# Patient Record
Sex: Female | Born: 1985
Health system: Southern US, Community
[De-identification: ages and names within clinical notes are randomized; demographics above are authoritative.]

## PROBLEM LIST (undated history)

## (undated) ENCOUNTER — Inpatient Hospital Stay (HOSPITAL_COMMUNITY): Payer: Self-pay

## (undated) HISTORY — PX: HERNIA REPAIR: SHX51

---
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
## 2008-02-27 ENCOUNTER — Emergency Department (HOSPITAL_COMMUNITY): Admission: EM | Admit: 2008-02-27 | Discharge: 2008-02-27 | Payer: Self-pay | Admitting: Family Medicine

## 2008-07-09 ENCOUNTER — Emergency Department (HOSPITAL_COMMUNITY): Admission: EM | Admit: 2008-07-09 | Discharge: 2008-07-09 | Payer: Self-pay | Admitting: Family Medicine

## 2008-09-23 ENCOUNTER — Emergency Department (HOSPITAL_COMMUNITY): Admission: EM | Admit: 2008-09-23 | Discharge: 2008-09-23 | Payer: Self-pay | Admitting: Family Medicine

## 2008-10-07 ENCOUNTER — Emergency Department (HOSPITAL_COMMUNITY): Admission: EM | Admit: 2008-10-07 | Discharge: 2008-10-07 | Payer: Self-pay | Admitting: Emergency Medicine

## 2008-10-21 ENCOUNTER — Ambulatory Visit: Payer: Self-pay | Admitting: Advanced Practice Midwife

## 2008-10-21 ENCOUNTER — Inpatient Hospital Stay (HOSPITAL_COMMUNITY): Admission: AD | Admit: 2008-10-21 | Discharge: 2008-10-21 | Payer: Self-pay | Admitting: Obstetrics

## 2009-10-18 ENCOUNTER — Emergency Department (HOSPITAL_COMMUNITY): Admission: EM | Admit: 2009-10-18 | Discharge: 2009-10-18 | Payer: Self-pay | Admitting: Emergency Medicine

## 2009-12-08 ENCOUNTER — Inpatient Hospital Stay (HOSPITAL_COMMUNITY): Admission: AD | Admit: 2009-12-08 | Discharge: 2009-12-08 | Payer: Self-pay | Admitting: Obstetrics

## 2009-12-08 ENCOUNTER — Ambulatory Visit: Payer: Self-pay | Admitting: Nurse Practitioner

## 2010-06-19 ENCOUNTER — Inpatient Hospital Stay (HOSPITAL_COMMUNITY)
Admission: AD | Admit: 2010-06-19 | Discharge: 2010-06-21 | DRG: 775 | Disposition: A | Discharge status: Home / Self Care | Payer: Medicaid Other | Source: Ambulatory Visit | Attending: Obstetrics and Gynecology | Admitting: Obstetrics and Gynecology

## 2010-06-20 LAB — CBC
HCT: 31.7 % — ABNORMAL LOW (ref 36.0–46.0)
Hemoglobin: 10.3 g/dL — ABNORMAL LOW (ref 12.0–15.0)
MCHC: 32.5 g/dL (ref 30.0–36.0)
MCV: 88.8 fL (ref 78.0–100.0)

## 2010-07-26 LAB — WET PREP, GENITAL: Yeast Wet Prep HPF POC: NONE SEEN

## 2010-07-26 LAB — URINALYSIS, ROUTINE W REFLEX MICROSCOPIC
Glucose, UA: NEGATIVE mg/dL
Hgb urine dipstick: NEGATIVE
Ketones, ur: 15 mg/dL — AB
pH: 6.5 (ref 5.0–8.0)

## 2010-08-18 LAB — GC/CHLAMYDIA PROBE AMP, GENITAL
Chlamydia, DNA Probe: NEGATIVE
GC Probe Amp, Genital: NEGATIVE

## 2010-08-19 LAB — URINALYSIS, ROUTINE W REFLEX MICROSCOPIC
Bilirubin Urine: NEGATIVE
Glucose, UA: NEGATIVE mg/dL
Hgb urine dipstick: NEGATIVE
Ketones, ur: NEGATIVE mg/dL
Nitrite: NEGATIVE
Protein, ur: NEGATIVE mg/dL
Specific Gravity, Urine: 1.008 (ref 1.005–1.030)
Urobilinogen, UA: 0.2 mg/dL (ref 0.0–1.0)
pH: 7 (ref 5.0–8.0)

## 2010-08-19 LAB — URINE CULTURE: Colony Count: 3000

## 2010-08-19 LAB — WET PREP, GENITAL
Trich, Wet Prep: NONE SEEN
Yeast Wet Prep HPF POC: NONE SEEN
Yeast Wet Prep HPF POC: NONE SEEN

## 2010-08-19 LAB — GC/CHLAMYDIA PROBE AMP, GENITAL
Chlamydia, DNA Probe: NEGATIVE
Chlamydia, DNA Probe: NEGATIVE
GC Probe Amp, Genital: NEGATIVE
GC Probe Amp, Genital: NEGATIVE

## 2010-08-21 LAB — POCT URINALYSIS DIP (DEVICE)
Ketones, ur: 15 mg/dL — AB
pH: 7 (ref 5.0–8.0)

## 2010-08-21 LAB — WET PREP, GENITAL: Clue Cells Wet Prep HPF POC: NONE SEEN
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
## 2011-08-04 ENCOUNTER — Emergency Department (HOSPITAL_COMMUNITY)
Admission: EM | Admit: 2011-08-04 | Discharge: 2011-08-04 | Discharge status: Against Medical Advice | Payer: Medicaid Other | Attending: Emergency Medicine | Admitting: Emergency Medicine

## 2011-08-04 ENCOUNTER — Encounter (HOSPITAL_COMMUNITY): Payer: Self-pay | Admitting: Emergency Medicine

## 2011-08-04 DIAGNOSIS — N898 Other specified noninflammatory disorders of vagina: Secondary | ICD-10-CM | POA: Insufficient documentation

## 2011-08-04 NOTE — ED Notes (Signed)
Called pt to take to treatment room without response noted from lobby

## 2011-08-04 NOTE — ED Notes (Signed)
Pt states she is having a thick white discharge from her vagina   Pt states it is starting to have an odor  Pt states it started about 2 weeks ago

## 2011-08-05 ENCOUNTER — Emergency Department (HOSPITAL_BASED_OUTPATIENT_CLINIC_OR_DEPARTMENT_OTHER)
Admission: EM | Admit: 2011-08-05 | Discharge: 2011-08-05 | Disposition: A | Discharge status: Home Health | Payer: Medicaid Other | Attending: Emergency Medicine | Admitting: Emergency Medicine

## 2011-08-05 ENCOUNTER — Encounter (HOSPITAL_BASED_OUTPATIENT_CLINIC_OR_DEPARTMENT_OTHER): Payer: Self-pay | Admitting: *Deleted

## 2011-08-05 DIAGNOSIS — N760 Acute vaginitis: Secondary | ICD-10-CM | POA: Insufficient documentation

## 2011-08-05 DIAGNOSIS — B9689 Other specified bacterial agents as the cause of diseases classified elsewhere: Secondary | ICD-10-CM

## 2011-08-05 DIAGNOSIS — A499 Bacterial infection, unspecified: Secondary | ICD-10-CM | POA: Insufficient documentation

## 2011-08-05 LAB — URINALYSIS, ROUTINE W REFLEX MICROSCOPIC
Bilirubin Urine: NEGATIVE
Glucose, UA: NEGATIVE mg/dL
Hgb urine dipstick: NEGATIVE
Ketones, ur: 40 mg/dL — AB
Nitrite: NEGATIVE
pH: 6 (ref 5.0–8.0)

## 2011-08-05 LAB — PREGNANCY, URINE: Preg Test, Ur: NEGATIVE

## 2011-08-05 LAB — WET PREP, GENITAL

## 2011-08-05 MED ORDER — METRONIDAZOLE 500 MG PO TABS: 500.0000 mg | ORAL_TABLET | Freq: Two times a day (BID) | ORAL | Status: AC

## 2011-08-05 NOTE — Discharge Instructions (Signed)
Bacterial Vaginosis Bacterial vaginosis (BV) is a vaginal infection where the normal balance of bacteria in the vagina is disrupted. The normal balance is then replaced by an overgrowth of certain bacteria. There are several different kinds of bacteria that can cause BV. BV is the most common vaginal infection in women of childbearing age. CAUSES   The cause of BV is not fully understood. BV develops when there is an increase or imbalance of harmful bacteria.   Some activities or behaviors can upset the normal balance of bacteria in the vagina and put women at increased risk including:   Having a new sex partner or multiple sex partners.   Douching.   Using an intrauterine device (IUD) for contraception.   It is not clear what role sexual activity plays in the development of BV. However, women that have never had sexual intercourse are rarely infected with BV.  Women do not get BV from toilet seats, bedding, swimming pools or from touching objects around them.  SYMPTOMS   Grey vaginal discharge.   A fish-like odor with discharge, especially after sexual intercourse.   Itching or burning of the vagina and vulva.   Burning or pain with urination.   Some women have no signs or symptoms at all.  DIAGNOSIS  Your caregiver must examine the vagina for signs of BV. Your caregiver will perform lab tests and look at the sample of vaginal fluid through a microscope. They will look for bacteria and abnormal cells (clue cells), a pH test higher than 4.5, and a positive amine test all associated with BV.  RISKS AND COMPLICATIONS   Pelvic inflammatory disease (PID).   Infections following gynecology surgery.   Developing HIV.   Developing herpes virus.  TREATMENT  Sometimes BV will clear up without treatment. However, all women with symptoms of BV should be treated to avoid complications, especially if gynecology surgery is planned. Female partners generally do not need to be treated. However,  BV may spread between female sex partners so treatment is helpful in preventing a recurrence of BV.   BV may be treated with antibiotics. The antibiotics come in either pill or vaginal cream forms. Either can be used with nonpregnant or pregnant women, but the recommended dosages differ. These antibiotics are not harmful to the baby.   BV can recur after treatment. If this happens, a second round of antibiotics will often be prescribed.   Treatment is important for pregnant women. If not treated, BV can cause a premature delivery, especially for a pregnant woman who had a premature birth in the past. All pregnant women who have symptoms of BV should be checked and treated.   For chronic reoccurrence of BV, treatment with a type of prescribed gel vaginally twice a week is helpful.  HOME CARE INSTRUCTIONS   Finish all medication as directed by your caregiver.   Do not have sex until treatment is completed.   Tell your sexual partner that you have a vaginal infection. They should see their caregiver and be treated if they have problems, such as a mild rash or itching.   Practice safe sex. Use condoms. Only have 1 sex partner.  PREVENTION  Basic prevention steps can help reduce the risk of upsetting the natural balance of bacteria in the vagina and developing BV:  Do not have sexual intercourse (be abstinent).   Do not douche.   Use all of the medicine prescribed for treatment of BV, even if the signs and symptoms go away.     Tell your sex partner if you have BV. That way, they can be treated, if needed, to prevent reoccurrence.  SEEK MEDICAL CARE IF:   Your symptoms are not improving after 3 days of treatment.   You have increased discharge, pain, or fever.  MAKE SURE YOU:   Understand these instructions.   Will watch your condition.   Will get help right away if you are not doing well or get worse.  FOR MORE INFORMATION  Division of STD Prevention (DSTDP), Centers for Disease  Control and Prevention: www.cdc.gov/std American Social Health Association (ASHA): www.ashastd.org  Document Released: 04/27/2005 Document Revised: 04/16/2011 Document Reviewed: 10/18/2008 Exit[employer] Patient Information 2012 [employer]. 

## 2011-08-05 NOTE — ED Notes (Signed)
Patient ambulatory to triage.  Patient reports white vaginal discharge times 2 weeks.  patient reports odor starting over the past few days.

## 2011-08-05 NOTE — ED Provider Notes (Addendum)
History     CSN: [id number]  Arrival date & time 08/05/11  1450   First MD Initiated Contact with Patient 08/05/11 1555      Chief Complaint  Patient presents with  . Vaginal Discharge    (Consider location/radiation/quality/duration/timing/severity/associated sxs/prior treatment) HPI Comments: Patient percent complaining of 2 weeks of white vaginal discharge.  She notes it's been slowly developing an odor over the last few days.  She has had bacterial vaginosis before and believes it is similar.  She denies any burning or itching.  She has had an infection with trichomonas per her report.  She states that she is sexually active with the same partner and does use condoms.  She is also on the Depo-Provera shot for birth control.  Her last menstrual period was last week and slightly longer duration for her than normal.  She denies any abdominal pain, fevers, nausea, vomiting.  Patient is a 26 y.o. female presenting with vaginal discharge. The history is provided by the patient. No language interpreter was used.  Vaginal Discharge This is a new problem. The current episode started more than 1 week ago. The problem occurs constantly. The problem has been gradually worsening. Pertinent negatives include no chest pain, no abdominal pain, no headaches and no shortness of breath. The symptoms are aggravated by nothing. The symptoms are relieved by nothing. She has tried nothing for the symptoms.    History reviewed. No pertinent past medical history.  Past Surgical History  Procedure Date  . Hernia repair     No family history on file.  History  Substance Use Topics  . Smoking status: Never Smoker   . Smokeless tobacco: Not on file  . Alcohol Use: No    OB History    Grav Para Term Preterm Abortions TAB SAB Ect Mult Living                  Review of Systems  Constitutional: Negative.  Negative for fever and chills.  HENT: Negative.   Eyes: Negative.  Negative for discharge and  redness.  Respiratory: Negative.  Negative for cough and shortness of breath.   Cardiovascular: Negative.  Negative for chest pain.  Gastrointestinal: Negative.  Negative for nausea, vomiting, abdominal pain and diarrhea.  Genitourinary: Positive for vaginal discharge. Negative for dysuria.  Musculoskeletal: Negative.  Negative for back pain.  Skin: Negative.  Negative for color change and rash.  Neurological: Negative.  Negative for syncope and headaches.  Hematological: Negative.  Negative for adenopathy.  Psychiatric/Behavioral: Negative.  Negative for confusion.  All other systems reviewed and are negative.    Allergies  Review of patient's allergies indicates no known allergies.  Home Medications  No current outpatient prescriptions on file.  BP 122/79  Pulse 102  Temp(Src) 98 F (36.7 C) (Oral)  Resp 20  SpO2 100%  LMP 07/23/2011  Physical Exam  Nursing note and vitals reviewed. Constitutional: She is oriented to person, place, and time. She appears well-developed and well-nourished.  Non-toxic appearance. She does not have a sickly appearance.  HENT:  Head: Normocephalic and atraumatic.  Eyes: Conjunctivae, EOM and lids are normal. Pupils are equal, round, and reactive to light. No scleral icterus.  Neck: Trachea normal and normal range of motion. Neck supple.  Cardiovascular: Normal rate, regular rhythm and normal heart sounds.   Pulmonary/Chest: Effort normal and breath sounds normal. No respiratory distress. She has no wheezes. She has no rales.  Abdominal: Soft. Normal appearance. There is no tenderness.  There is no rebound, no guarding and no CVA tenderness.  Genitourinary: No erythema, tenderness or bleeding around the vagina. No foreign body around the vagina. Vaginal discharge found.       Examination chaperoned by Jeannett Senior, RN.  White discharge is present in the vaginal vault.  No bleeding.  No erythema.  No cervical motion tenderness.  No adnexal masses  palpated.  Musculoskeletal: Normal range of motion.  Neurological: She is alert and oriented to person, place, and time. She has normal strength.  Skin: Skin is warm, dry and intact. No rash noted.  Psychiatric: She has a normal mood and affect. Her behavior is normal. Judgment and thought content normal.    ED Course  Procedures (including critical care time)  Results for orders placed during the hospital encounter of 08/05/11  PREGNANCY, URINE      Component Value Range   Preg Test, Ur NEGATIVE  NEGATIVE   URINALYSIS, ROUTINE W REFLEX MICROSCOPIC      Component Value Range   Color, Urine YELLOW  YELLOW    APPearance CLEAR  CLEAR    Specific Gravity, Urine 1.030  1.005 - 1.030    pH 6.0  5.0 - 8.0    Glucose, UA NEGATIVE  NEGATIVE (mg/dL)   Hgb urine dipstick NEGATIVE  NEGATIVE    Bilirubin Urine NEGATIVE  NEGATIVE    Ketones, ur 40 (*) NEGATIVE (mg/dL)   Protein, ur NEGATIVE  NEGATIVE (mg/dL)   Urobilinogen, UA 0.2  0.0 - 1.0 (mg/dL)   Nitrite NEGATIVE  NEGATIVE    Leukocytes, UA NEGATIVE  NEGATIVE   WET PREP, GENITAL      Component Value Range   Yeast Wet Prep HPF POC NONE SEEN  NONE SEEN    Trich, Wet Prep NONE SEEN  NONE SEEN    Clue Cells Wet Prep HPF POC MANY (*) NONE SEEN    WBC, Wet Prep HPF POC TOO NUMEROUS TO COUNT (*) NONE SEEN         MDM  Clinically patient appears to have bacterial vaginosis.  I will evaluate her wet prep results to further delineate if patient needs treatment for possible trichomonas as she's had this in the past.  Patient has been informed that gonorrhea and chlamydia swab was sent and that she will be called if they come up as a positive result as clinically this does not appear to be consistent with cervicitis or a sexually transmitted infection at this time.        Nat Christen, MD 08/05/11 1634  Nat Christen, MD 08/05/11 [phone]

## 2011-08-06 LAB — GC/CHLAMYDIA PROBE AMP, GENITAL
Chlamydia, DNA Probe: NEGATIVE
GC Probe Amp, Genital: NEGATIVE

## 2012-08-01 ENCOUNTER — Other Ambulatory Visit (HOSPITAL_COMMUNITY)
Admission: RE | Admit: 2012-08-01 | Discharge: 2012-08-01 | Disposition: A | Discharge status: Home / Self Care | Payer: Self-pay | Source: Ambulatory Visit | Attending: Family Medicine | Admitting: Family Medicine

## 2012-08-01 ENCOUNTER — Emergency Department (INDEPENDENT_AMBULATORY_CARE_PROVIDER_SITE_OTHER)
Admission: EM | Admit: 2012-08-01 | Discharge: 2012-08-01 | Disposition: A | Discharge status: Home / Self Care | Payer: Self-pay | Source: Home / Self Care | Attending: Family Medicine | Admitting: Family Medicine

## 2012-08-01 ENCOUNTER — Encounter (HOSPITAL_COMMUNITY): Payer: Self-pay | Admitting: Emergency Medicine

## 2012-08-01 DIAGNOSIS — Z113 Encounter for screening for infections with a predominantly sexual mode of transmission: Secondary | ICD-10-CM | POA: Insufficient documentation

## 2012-08-01 DIAGNOSIS — B3731 Acute candidiasis of vulva and vagina: Secondary | ICD-10-CM

## 2012-08-01 DIAGNOSIS — N760 Acute vaginitis: Secondary | ICD-10-CM | POA: Insufficient documentation

## 2012-08-01 DIAGNOSIS — B373 Candidiasis of vulva and vagina: Secondary | ICD-10-CM

## 2012-08-01 MED ORDER — FLUCONAZOLE 150 MG PO TABS: 150.0000 mg | ORAL_TABLET | Freq: Once | ORAL | Status: AC

## 2012-08-01 NOTE — ED Notes (Signed)
Reports thick white vaginal discharge x 3 wks with stomach pains that come and go.  Pt denies urinary symptoms and irritation / odor. No concerns for stds.  Pt has not tried any otc meds for symptoms.

## 2012-08-01 NOTE — ED Provider Notes (Signed)
History     CSN: [id number]  Arrival date & time 08/01/12  1717   First MD Initiated Contact with Patient 08/01/12 1908      Chief Complaint  Patient presents with  . Vaginal Discharge    x 3 wks.     (Consider location/radiation/quality/duration/timing/severity/associated sxs/prior treatment) HPI Comments: Pt thinks has yeast infection.   Patient is a 27 y.o. female presenting with vaginal discharge. The history is provided by the patient.  Vaginal Discharge This is a new problem. Episode onset: 3.5 weeks ago. The problem occurs constantly. The problem has been gradually worsening. Associated symptoms include abdominal pain. Associated symptoms comments: Will have sharp abd pain about once every 2 weeks lasting about 45 minutes.  They spontaneously resolve. . Nothing aggravates the symptoms. Nothing relieves the symptoms. She has tried nothing for the symptoms.    History reviewed. No pertinent past medical history.  Past Surgical History  Procedure Laterality Date  . Hernia repair      History reviewed. No pertinent family history.  History  Substance Use Topics  . Smoking status: Never Smoker   . Smokeless tobacco: Not on file  . Alcohol Use: No    OB History   Grav Para Term Preterm Abortions TAB SAB Ect Mult Living                  Review of Systems  Constitutional: Negative for fever and chills.  Gastrointestinal: Positive for abdominal pain. Negative for nausea, vomiting, diarrhea and constipation.  Genitourinary: Positive for vaginal discharge. Negative for dysuria, hematuria, flank pain, vaginal bleeding and vaginal pain.    Allergies  Review of patient's allergies indicates no known allergies.  Home Medications   Current Outpatient Rx  Name  Route  Sig  Dispense  Refill  . fluconazole (DIFLUCAN) 150 MG tablet   Oral   Take 1 tablet (150 mg total) by mouth once. May repeat in one week if needed.   2 tablet   0     BP 122/78  Pulse 81   Temp(Src) 98.3 F (36.8 C) (Oral)  SpO2 100%  Physical Exam  Constitutional: She appears well-developed and well-nourished. No distress.  Cardiovascular: Normal rate and regular rhythm.   Pulmonary/Chest: Effort normal and breath sounds normal.  Abdominal: Normal appearance and bowel sounds are normal. She exhibits no distension. There is no tenderness. There is no rigidity, no rebound, no guarding and no CVA tenderness.  Genitourinary: Uterus normal. There is no rash, tenderness or lesion on the right labia. There is no rash, tenderness or lesion on the left labia. Cervix exhibits no motion tenderness. Right adnexum displays no tenderness. Left adnexum displays no tenderness. No erythema, tenderness or bleeding around the vagina. Vaginal discharge found.  Lymphadenopathy:       Right: No inguinal adenopathy present.       Left: No inguinal adenopathy present.    ED Course  Procedures (including critical care time)  Labs Reviewed  CERVICOVAGINAL ANCILLARY ONLY   No results found.   1. Candida vaginitis       MDM          Cathlyn Parsons, NP 08/01/12 1913

## 2012-08-03 NOTE — ED Provider Notes (Signed)
Medical screening examination/treatment/procedure(s) were performed by resident physician or non-physician practitioner and as supervising physician I was immediately available for consultation/collaboration.   Serine Kea DOUGLAS MD.   Tane Biegler D Ossie Beltran, MD 08/03/12 2045 

## 2012-10-16 ENCOUNTER — Emergency Department (HOSPITAL_BASED_OUTPATIENT_CLINIC_OR_DEPARTMENT_OTHER)
Admission: EM | Admit: 2012-10-16 | Discharge: 2012-10-16 | Disposition: A | Discharge status: Home / Self Care | Payer: Self-pay | Attending: Emergency Medicine | Admitting: Emergency Medicine

## 2012-10-16 ENCOUNTER — Encounter (HOSPITAL_BASED_OUTPATIENT_CLINIC_OR_DEPARTMENT_OTHER): Payer: Self-pay | Admitting: *Deleted

## 2012-10-16 DIAGNOSIS — Z3202 Encounter for pregnancy test, result negative: Secondary | ICD-10-CM | POA: Insufficient documentation

## 2012-10-16 DIAGNOSIS — B9689 Other specified bacterial agents as the cause of diseases classified elsewhere: Secondary | ICD-10-CM

## 2012-10-16 DIAGNOSIS — N760 Acute vaginitis: Secondary | ICD-10-CM

## 2012-10-16 LAB — URINALYSIS, ROUTINE W REFLEX MICROSCOPIC
Bilirubin Urine: NEGATIVE
Leukocytes, UA: NEGATIVE
Nitrite: NEGATIVE
Specific Gravity, Urine: 1.039 — ABNORMAL HIGH (ref 1.005–1.030)
pH: 6 (ref 5.0–8.0)

## 2012-10-16 LAB — WET PREP, GENITAL: Trich, Wet Prep: NONE SEEN

## 2012-10-16 MED ORDER — METRONIDAZOLE 500 MG PO TABS: 500.0000 mg | ORAL_TABLET | Freq: Two times a day (BID) | ORAL | Status: DC

## 2012-10-16 NOTE — ED Notes (Signed)
Pt states she has had white vaginal d/c x 1-1/2 months. Seen at [facility] for same and given PO meds x 2 days, but "it never cleared up."

## 2012-10-16 NOTE — ED Provider Notes (Signed)
Medical screening examination/treatment/procedure(s) were performed by non-physician practitioner and as supervising physician I was immediately available for consultation/collaboration.  Amirah Goerke J. Tim Corriher, MD 10/16/12 2251 

## 2012-10-16 NOTE — ED Provider Notes (Signed)
History     CSN: [id number]  Arrival date & time 10/16/12  1529   First MD Initiated Contact with Patient 10/16/12 1737      Chief Complaint  Patient presents with  . Vaginal Discharge    (Consider location/radiation/quality/duration/timing/severity/associated sxs/prior treatment) Patient is a 27 y.o. female presenting with vaginal discharge. The history is provided by the patient. No language interpreter was used.  Vaginal Discharge Quality:  Yellow Severity:  Moderate Onset quality:  Gradual Duration:  1 week Timing:  Constant Progression:  Worsening Chronicity:  New Relieved by:  Nothing Worsened by:  Nothing tried Ineffective treatments:  None tried Associated symptoms: no vaginal itching   Risk factors: no unprotected sex     History reviewed. No pertinent past medical history.  Past Surgical History  Procedure Laterality Date  . Hernia repair      History reviewed. No pertinent family history.  History  Substance Use Topics  . Smoking status: Never Smoker   . Smokeless tobacco: Not on file  . Alcohol Use: No    OB History   Grav Para Term Preterm Abortions TAB SAB Ect Mult Living                  Review of Systems  Genitourinary: Positive for vaginal discharge.  All other systems reviewed and are negative.    Allergies  Review of patient's allergies indicates no known allergies.  Home Medications  No current outpatient prescriptions on file.  BP 114/82  Pulse 82  Temp(Src) 99 F (37.2 C) (Oral)  Resp 18  Ht 5\' 2"  (1.575 m)  Wt 90 lb (40.824 kg)  BMI 16.46 kg/m2  SpO2 100%  Physical Exam  Nursing note and vitals reviewed. Constitutional: She appears well-developed and well-nourished.  HENT:  Head: Normocephalic.  Right Ear: External ear normal.  Left Ear: External ear normal.  Eyes: Conjunctivae and EOM are normal. Pupils are equal, round, and reactive to light.  Neck: Normal range of motion. Neck supple.  Cardiovascular: Normal  rate and normal heart sounds.   Pulmonary/Chest: Effort normal.  Abdominal: Soft.  Genitourinary: Uterus normal. Vaginal discharge found.  Musculoskeletal: Normal range of motion.  Neurological: She is alert.  Skin: Skin is warm.    ED Course  Procedures (including critical care time)  Labs Reviewed  WET PREP, GENITAL - Abnormal; Notable for the following:    Yeast Wet Prep HPF POC FEW (*)    Clue Cells Wet Prep HPF POC MANY (*)    WBC, Wet Prep HPF POC MANY (*)    All other components within normal limits  URINALYSIS, ROUTINE W REFLEX MICROSCOPIC - Abnormal; Notable for the following:    Specific Gravity, Urine 1.039 (*)    Ketones, ur 15 (*)    All other components within normal limits  GC/CHLAMYDIA PROBE AMP  PREGNANCY, URINE   No results found.   1. Bacterial vaginitis       MDM  Pt given rx for flagyl Follow up with primary care MD        Elson Areas, PA-C 10/16/12 1900

## 2013-03-06 ENCOUNTER — Emergency Department (INDEPENDENT_AMBULATORY_CARE_PROVIDER_SITE_OTHER)
Admission: EM | Admit: 2013-03-06 | Discharge: 2013-03-06 | Disposition: A | Discharge status: Home / Self Care | Payer: Medicaid Other | Source: Home / Self Care | Attending: Family Medicine | Admitting: Family Medicine

## 2013-03-06 ENCOUNTER — Other Ambulatory Visit (HOSPITAL_COMMUNITY)
Admission: RE | Admit: 2013-03-06 | Discharge: 2013-03-06 | Disposition: A | Discharge status: Home / Self Care | Payer: Medicaid Other | Source: Ambulatory Visit | Attending: Family Medicine | Admitting: Family Medicine

## 2013-03-06 ENCOUNTER — Encounter (HOSPITAL_COMMUNITY): Payer: Self-pay | Admitting: Emergency Medicine

## 2013-03-06 DIAGNOSIS — Z113 Encounter for screening for infections with a predominantly sexual mode of transmission: Secondary | ICD-10-CM | POA: Insufficient documentation

## 2013-03-06 DIAGNOSIS — N760 Acute vaginitis: Secondary | ICD-10-CM | POA: Insufficient documentation

## 2013-03-06 DIAGNOSIS — N730 Acute parametritis and pelvic cellulitis: Secondary | ICD-10-CM

## 2013-03-06 LAB — POCT URINALYSIS DIP (DEVICE)
Ketones, ur: NEGATIVE mg/dL
Protein, ur: NEGATIVE mg/dL
Specific Gravity, Urine: >=1.03 (ref 1.005–1.030)
pH: 6.5 (ref 5.0–8.0)

## 2013-03-06 MED ORDER — AZITHROMYCIN 250 MG PO TABS
ORAL_TABLET | ORAL | Status: AC
  Filled 2013-03-06: qty 4

## 2013-03-06 MED ORDER — METRONIDAZOLE 500 MG PO TABS: 500.0000 mg | ORAL_TABLET | Freq: Two times a day (BID) | ORAL | Status: DC

## 2013-03-06 MED ORDER — LIDOCAINE HCL (PF) 1 % IJ SOLN
INTRAMUSCULAR | Status: AC
  Filled 2013-03-06: qty 5

## 2013-03-06 MED ORDER — CEFTRIAXONE SODIUM 250 MG IJ SOLR
INTRAMUSCULAR | Status: AC
  Filled 2013-03-06: qty 250

## 2013-03-06 MED ORDER — CEFTRIAXONE SODIUM 1 G IJ SOLR
250.0000 mg | Freq: Once | INTRAMUSCULAR | Status: AC
  Administered 2013-03-06: 250 mg via INTRAMUSCULAR

## 2013-03-06 MED ORDER — AZITHROMYCIN 250 MG PO TABS
1000.0000 mg | ORAL_TABLET | Freq: Once | ORAL | Status: AC
  Administered 2013-03-06: 1000 mg via ORAL

## 2013-03-06 NOTE — ED Notes (Signed)
Vaginal discharge

## 2013-03-06 NOTE — ED Provider Notes (Signed)
CSN: [id number]     Arrival date & time 03/06/13  1818 History   First MD Initiated Contact with Patient 03/06/13 1857     Chief Complaint  Patient presents with  . Vaginal Discharge   (Consider location/radiation/quality/duration/timing/severity/associated sxs/prior Treatment) Patient is a 27 y.o. female presenting with vaginal discharge. The history is provided by the patient.  Vaginal Discharge Quality:  Yellow and malodorous Severity:  Moderate Onset quality:  Gradual Duration:  3 months (seen in ER mos ago and sx continue) Progression:  Worsening (pelvic pain for sev days.) Chronicity:  Chronic Risk factors: unprotected sex     History reviewed. No pertinent past medical history. Past Surgical History  Procedure Laterality Date  . Hernia repair     No family history on file. History  Substance Use Topics  . Smoking status: Never Smoker   . Smokeless tobacco: Not on file  . Alcohol Use: No   OB History   Grav Para Term Preterm Abortions TAB SAB Ect Mult Living                 Review of Systems  Constitutional: Negative.   Gastrointestinal: Negative.   Genitourinary: Positive for vaginal discharge and pelvic pain. Negative for vaginal bleeding and vaginal pain.  Musculoskeletal: Negative.     Allergies  Review of patient's allergies indicates no known allergies.  Home Medications   Current Outpatient Rx  Name  Route  Sig  Dispense  Refill  . metroNIDAZOLE (FLAGYL) 500 MG tablet   Oral   Take 1 tablet (500 mg total) by mouth 2 (two) times daily.   14 tablet   0   . metroNIDAZOLE (FLAGYL) 500 MG tablet   Oral   Take 1 tablet (500 mg total) by mouth 2 (two) times daily.   14 tablet   0    BP 123/87  Pulse 103  Temp(Src) 98.2 F (36.8 C) (Oral)  Resp 16  SpO2 100% Physical Exam  Nursing note and vitals reviewed. Constitutional: She is oriented to person, place, and time. She appears well-developed and well-nourished.  Abdominal: Soft. Bowel  sounds are normal. She exhibits no mass. There is no tenderness. There is no rebound and no guarding.  Genitourinary: Uterus is tender. Uterus is not enlarged. Cervix exhibits motion tenderness and discharge. Cervix exhibits no friability. Right adnexum displays tenderness. Right adnexum displays no fullness. Left adnexum displays tenderness. Left adnexum displays no fullness. No erythema, tenderness or bleeding around the vagina. No foreign body around the vagina. Vaginal discharge found.  Neurological: She is alert and oriented to person, place, and time.  Skin: Skin is warm and dry.    ED Course  Procedures (including critical care time) Labs Review Labs Reviewed  CERVICOVAGINAL ANCILLARY ONLY   Imaging Review No results found.    MDM      Linna Hoff, MD 03/06/13 [phone]

## 2013-03-06 NOTE — ED Notes (Signed)
Patient has a post-injection delay prior to being discharged 

## 2013-03-10 NOTE — ED Notes (Signed)
Lab review

## 2013-03-12 NOTE — ED Notes (Signed)
GC/Chlamydia neg., Affirm: Candida and Trich neg., Gardnerella pos.  Pt. adequately treated with Flagyl. Vassie Moselle 03/12/2013

## 2013-03-19 ENCOUNTER — Inpatient Hospital Stay (HOSPITAL_COMMUNITY)
Admission: AD | Admit: 2013-03-19 | Discharge: 2013-03-19 | Disposition: A | Discharge status: Home / Self Care | Payer: Medicaid Other | Source: Ambulatory Visit | Attending: Obstetrics & Gynecology | Admitting: Obstetrics & Gynecology

## 2013-03-19 ENCOUNTER — Encounter (HOSPITAL_COMMUNITY): Payer: Self-pay | Admitting: *Deleted

## 2013-03-19 DIAGNOSIS — N898 Other specified noninflammatory disorders of vagina: Secondary | ICD-10-CM | POA: Insufficient documentation

## 2013-03-19 LAB — WET PREP, GENITAL
Trich, Wet Prep: NONE SEEN
Yeast Wet Prep HPF POC: NONE SEEN

## 2013-03-19 LAB — URINALYSIS, ROUTINE W REFLEX MICROSCOPIC
Bilirubin Urine: NEGATIVE
Hgb urine dipstick: NEGATIVE
Specific Gravity, Urine: >1.03 — ABNORMAL HIGH (ref 1.005–1.030)
pH: 6 (ref 5.0–8.0)

## 2013-03-19 NOTE — MAU Note (Cosign Needed)
Pt presents with complaints of vaginal discharge that has an odor. She states she has been evaluated in urgent care recently a couple of times and was treated for BV and PID.

## 2013-03-19 NOTE — MAU Provider Note (Signed)
CC: Vaginal Discharge    First Provider Initiated Contact with Patient 03/19/13 1437      HPI Ann Knapp is a 27 y.o. [id number] who presents with white vaginal discharge that she feels is abnormal and unchanged since she was treated for presumptive PID and confirmed BV on 03/06/2013 at Urgent Care. She believes she's having increased discharge due to being on Depo-Provera. She denies irritation or itch. Last intercourse was 3-4 weeks ago. Denies pelvic pain or dysparunia Irrregular light menses or amenorrheic on Depo-Provera.   Record review: recurrent BV.  GC/CT neg 03/06/2013.  History reviewed. No pertinent past medical history.  OB History  Gravida Para Term Preterm AB SAB TAB Ectopic Multiple Living  3 2 2  1  1  0 0 2    # Outcome Date GA Lbr Len/2nd Weight Sex Delivery Anes PTL Lv  3 TRM 06/19/10    F SVD EPI  Y  2 TRM 08/26/04    M SVD EPI  Y  1 TAB               Past Surgical History  Procedure Laterality Date  . Hernia repair      History   Social History  . Marital Status: Single    Spouse Name: N/A    Number of Children: N/A  . Years of Education: N/A   Occupational History  . Not on file.   Social History Main Topics  . Smoking status: Never Smoker   . Smokeless tobacco: Not on file  . Alcohol Use: No  . Drug Use: No  . Sexual Activity: Yes    Birth Control/ Protection: Injection, Condom   Other Topics Concern  . Not on file   Social History Narrative  . No narrative on file    No current facility-administered medications on file prior to encounter.   No current outpatient prescriptions on file prior to encounter.    No Known Allergies  ROS Pertinent items in HPI  PHYSICAL EXAM Filed Vitals:   03/19/13 1424  BP: 118/81  Pulse: 111  Temp: 98.1 F (36.7 C)  Resp: 18   General: Well nourished, well developed female in no acute distress Cardiovascular: Normal rate Respiratory: Normal effort Abdomen: Soft, nontender Back: No  CVAT Extremities: No edema Neurologic: Alert and oriented Speculum exam: NEFG; vagina with small amount white discharge, no blood; cervix clean Bimanual exam: cervix closed, no CMT; uterus NSSP; no adnexal tenderness or masses   LAB RESULTS Results for orders placed during the hospital encounter of 03/19/13 (from the past 24 hour(s))  URINALYSIS, ROUTINE W REFLEX MICROSCOPIC     Status: Abnormal   Collection Time    03/19/13  2:13 PM      Result Value Range   Color, Urine YELLOW  YELLOW   APPearance CLEAR  CLEAR   Specific Gravity, Urine >1.030 (*) 1.005 - 1.030   pH 6.0  5.0 - 8.0   Glucose, UA NEGATIVE  NEGATIVE mg/dL   Hgb urine dipstick NEGATIVE  NEGATIVE   Bilirubin Urine NEGATIVE  NEGATIVE   Ketones, ur NEGATIVE  NEGATIVE mg/dL   Protein, ur NEGATIVE  NEGATIVE mg/dL   Urobilinogen, UA 0.2  0.0 - 1.0 mg/dL   Nitrite NEGATIVE  NEGATIVE   Leukocytes, UA NEGATIVE  NEGATIVE  POCT PREGNANCY, URINE     Status: None   Collection Time    03/19/13  2:14 PM      Result Value Range   Preg Test,  Ur NEGATIVE  NEGATIVE  WET PREP, GENITAL     Status: Abnormal   Collection Time    03/19/13  2:48 PM      Result Value Range   Yeast Wet Prep HPF POC NONE SEEN  NONE SEEN   Trich, Wet Prep NONE SEEN  NONE SEEN   Clue Cells Wet Prep HPF POC NONE SEEN  NONE SEEN   WBC, Wet Prep HPF POC FEW (*) NONE SEEN    IMAGING No results found.  MAU COURSE   ASSESSMENT  1. Vaginal discharge     PLAN Discharge home with reassurance. Safe sex, normalization of vaginal pH discussed . See AVS for patient education.    Medication List    Notice   You have not been prescribed any medications.     Follow-up Information   Schedule an appointment as soon as possible for a visit with [facility].   Contact information:   [address] [place] [address]        Danae Orleans, CNM 03/19/2013 2:41 PM

## 2013-03-21 NOTE — MAU Provider Note (Signed)
Attestation of Attending Supervision of Advanced Practitioner (CNM/NP): Evaluation and management procedures were performed by the Advanced Practitioner under my supervision and collaboration. I have reviewed the Advanced Practitioner's note and chart, and I agree with the management and plan.  Zahira Brummond H. 5:20 PM

## 2014-03-12 ENCOUNTER — Encounter (HOSPITAL_COMMUNITY): Payer: Self-pay | Admitting: *Deleted

## 2014-10-11 ENCOUNTER — Ambulatory Visit (HOSPITAL_COMMUNITY): Payer: Self-pay

## 2014-10-12 ENCOUNTER — Encounter: Payer: Self-pay | Admitting: Obstetrics & Gynecology

## 2014-10-30 ENCOUNTER — Encounter (HOSPITAL_COMMUNITY): Payer: Self-pay | Admitting: *Deleted

## 2014-11-01 ENCOUNTER — Encounter (HOSPITAL_COMMUNITY): Payer: Self-pay

## 2014-11-01 ENCOUNTER — Ambulatory Visit (HOSPITAL_COMMUNITY)
Admission: RE | Admit: 2014-11-01 | Discharge: 2014-11-01 | Disposition: A | Discharge status: Home / Self Care | Payer: Self-pay | Source: Ambulatory Visit | Attending: Obstetrics and Gynecology | Admitting: Obstetrics and Gynecology

## 2014-11-01 VITALS — BP 100/68 | Temp 98.7°F | Ht 62.0 in | Wt 112.0 lb

## 2014-11-01 DIAGNOSIS — R87612 Low grade squamous intraepithelial lesion on cytologic smear of cervix (LGSIL): Secondary | ICD-10-CM

## 2014-11-01 DIAGNOSIS — Z1239 Encounter for other screening for malignant neoplasm of breast: Secondary | ICD-10-CM

## 2014-11-01 NOTE — Progress Notes (Signed)
CLINIC:  [facility] [employer] [facility] [employer]  REASON FOR VISIT: Well-woman exam    HISTORY OF PRESENT ILLNESS:  Ann Knapp is a 29 y.o. female who presents to the [facility] today for clinical breast exam.  She does report having a cousin who was diagnosed with breast cancer at age 68, who passed away last year from cancer.  Ann Knapp performs monthly self breast exams and has not noted any abnormalities in either breast.  She has no complaints today.  Her last pap smear was on 08/14/14 and showed LGSIL.  This was her first abnormal pap smear. She is scheduled for colposcopy tomorrow.    REVIEW OF SYSTEMS:  Denies any breast pain, nodularity, nipple inversion, or nipple discharge bilaterally.   ALLERGIES: No Known Allergies  CURRENT MEDICATIONS:  No current outpatient prescriptions on file prior to encounter.   No current facility-administered medications on file prior to encounter.     PHYSICAL EXAM:  Vitals:  Filed Vitals:   11/01/14 1422  BP: 100/68  Temp: 98.7 F (37.1 C)   General: Well-nourished, well-appearing female in no acute distress.  She is unaccompanied in clinic today.  Stoney Bang, LPN was present during physical exam for this patient.  Breasts: Bilateral breasts exposed and observed with patient standing (arms at side, arms on hips, arms on hips flexed forward, and arms over head).  No gross abnormalities including breast skin puckering or dimpling noted on observation.  Breasts symmetrical without evidence of skin redness, thickening, or peau d'orange appearance. No nipple retraction or nipple discharge noted bilaterally.  No breast nodularity palpated in bilateral breasts.  Normal fibrocystic breast changes noted in bilateral breasts. Axillary lymph nodes: No axillary lymphadenopathy bilaterally.   GU: Exam deferred. Pap smear is up-to-date.  ASSESSMENT & PLAN:   1. Breast cancer screening: Ann Knapp has no palpable breast abnormalities  on her clinical breast exam today.  She has normal fibrocystic changes in bilateral breasts.  She was given instructions and educational materials regarding breast self-awareness. Ann Knapp is aware of this plan and agrees with it.   2. Cervical cancer screening: Ann Knapp will have her colposcopy as scheduled tomorrow.  Stoney Bang, LPN reviewed the procedure and potential side effects with the patient today in preparation for her procedure.  Ann Knapp verbalized understanding and agrees with this plan.   Ann Knapp was encouraged to ask questions and all questions were answered to her satisfaction.    Lubertha Basque, NP [facility]  [phone]

## 2014-11-02 ENCOUNTER — Ambulatory Visit (INDEPENDENT_AMBULATORY_CARE_PROVIDER_SITE_OTHER): Payer: Self-pay | Admitting: Obstetrics & Gynecology

## 2014-11-02 ENCOUNTER — Other Ambulatory Visit (HOSPITAL_COMMUNITY)
Admission: RE | Admit: 2014-11-02 | Discharge: 2014-11-02 | Disposition: A | Discharge status: Home / Self Care | Payer: Self-pay | Source: Ambulatory Visit | Attending: Obstetrics & Gynecology | Admitting: Obstetrics & Gynecology

## 2014-11-02 ENCOUNTER — Encounter: Payer: Self-pay | Admitting: Obstetrics & Gynecology

## 2014-11-02 VITALS — BP 109/79 | HR 77 | Ht 62.0 in | Wt 99.2 lb

## 2014-11-02 DIAGNOSIS — R896 Abnormal cytological findings in specimens from other organs, systems and tissues: Secondary | ICD-10-CM

## 2014-11-02 DIAGNOSIS — IMO0002 Reserved for concepts with insufficient information to code with codable children: Secondary | ICD-10-CM

## 2014-11-02 LAB — POCT PREGNANCY, URINE: PREG TEST UR: NEGATIVE

## 2014-11-02 NOTE — Progress Notes (Signed)
Patient ID: Ann Knapp, female   DOB: Sep 12, 1985, 29 y.o.   MRN: [id number] Patient given informed consent, signed copy in the chart, time out was performed.  Placed in lithotomy position. Cervix viewed with speculum and colposcope after application of acetic acid.  08/14/2014: LGSIL  Colposcopy adequate?  yes Acetowhite lesions?yes Punctation?no Mosaicism?  no Abnormal vasculature?  no Biopsies?yes  ECC?yes  Patient was given post procedure instructions.  She will return in 1 year for repeat PAP or sooner if CIN III noted.  Darvin Dials L. Harraway-Smith, M.D., [place]

## 2014-11-02 NOTE — Patient Instructions (Signed)
Cervical Dysplasia Cervical dysplasia is a condition in which a woman has abnormal changes in the cells of her cervix. The cervix is the opening to the uterus (womb). It is located between the vagina and the uterus. Cervical dysplasia may be the first sign of cervical cancer.  With early detection, treatment, and close follow-up care, nearly all cases of cervical dysplasia can be cured. If left untreated, dysplasia may become more severe.  CAUSES  Cervical dysplasia can be caused by a human papillomavirus (HPV) infection. RISK FACTORS   Having had a sexually transmitted disease, such as chlamydia or a human papillomavirus (HPV) infection.   Becoming sexually active before age 18.   Having had more than one sexual partner.   Not using protection during sexual intercourse, especially with new sexual partners.   Having had cancer of the vagina or vulva.   Having a sexual partner whose previous partner had cancer of the cervix or cervical dysplasia.   Having a sexual partner who has or has had cancer of the penis.   Having a weakened immune system (such as from having HIV or an organ transplant).   Being the daughter of a woman who took diethylstilbestrol(DES) during pregnancy.   Having a family history of cervical cancer.   Smoking. SIGNS AND SYMPTOMS  There are usually no symptoms. If there are symptoms, they may include:   Abnormal vaginal discharge.   Bleeding between periods or after intercourse.   Bleeding during menopause.   Pain during sexual intercourse (dyspareunia). DIAGNOSIS  A test called a Pap test may be done.During this test, cells are taken from the cervix and then looked at under a microscope. A test in which tissue is removed from the cervix (biopsy) may also be done if the Pap test is abnormal or if the cervix looks abnormal.  TREATMENT  Treatment varies based on the severity of the cervical dysplasia. Treatment may include:  Cryotherapy.  During cryotherapy, the abnormal cells are frozen with a steel-tip instrument.   A procedure to remove abnormal tissue from the cervix.  Surgery to remove abnormal tissue. This is usually done in serious cases of cervical dysplasia. Surgical options include:  A cone biopsy. This is a procedure in which the cervical canal and a portion of the center of the cervix are removed.   Hysterectomy. This is a surgery in which the uterus and cervix are removed. HOME CARE INSTRUCTIONS   Only take over-the-counter or prescription medicines for pain or discomfort as directed by your health care provider.   Do not use tampons, have sexual intercourse, or douche until your health care provider says it is okay.  Keep follow-up appointments as directed by your health care provider. Women who have been treated for cervical dysplasia should have regular pelvic exams and Pap tests. During the first year following treatment of cervical dysplasia, Pap tests should be done every 3-4 months. In the second year, they should be done every 6 months or as recommended by your health care provider.  To prevent the condition from developing again, practice safe sex. SEEK MEDICAL CARE IF:  You develop genital warts.  SEEK IMMEDIATE MEDICAL CARE IF:   Your menstrual period is heavier than normal.   You develop bright red bleeding, especially if you have blood clots.   You have a fever.   You have increasing cramps or pain not relieved with medicine.   You are light-headed, unusually weak, or have fainting spells.   You have abnormal   vaginal discharge.   You have abdominal pain. Document Released: 04/27/2005 Document Revised: 05/02/2013 Document Reviewed: 12/21/2012 Exit[employer] Patient Information 2015 [employer]. This information is not intended to replace advice given to you by your health care provider. Make sure you discuss any questions you have with your health care provider.  

## 2014-11-07 ENCOUNTER — Telehealth: Payer: Self-pay

## 2014-11-07 NOTE — Telephone Encounter (Signed)
Per Dr. Erin FullingHarraway-Smith, colpo showed LSIL-- needs repeat pap with co testing in 1 year. Called patient and informed her of results and recommendations. Patient verbalized understanding and gratitude. No questions or concerns.

## 2015-02-17 ENCOUNTER — Encounter (HOSPITAL_COMMUNITY): Payer: Self-pay | Admitting: *Deleted

## 2015-02-17 ENCOUNTER — Inpatient Hospital Stay (HOSPITAL_COMMUNITY)
Admission: AD | Admit: 2015-02-17 | Discharge: 2015-02-17 | Disposition: A | Discharge status: Home / Self Care | Payer: Self-pay | Source: Ambulatory Visit | Attending: Obstetrics & Gynecology | Admitting: Obstetrics & Gynecology

## 2015-02-17 DIAGNOSIS — B9689 Other specified bacterial agents as the cause of diseases classified elsewhere: Secondary | ICD-10-CM | POA: Insufficient documentation

## 2015-02-17 DIAGNOSIS — Z113 Encounter for screening for infections with a predominantly sexual mode of transmission: Secondary | ICD-10-CM | POA: Insufficient documentation

## 2015-02-17 DIAGNOSIS — N760 Acute vaginitis: Secondary | ICD-10-CM | POA: Insufficient documentation

## 2015-02-17 DIAGNOSIS — A499 Bacterial infection, unspecified: Secondary | ICD-10-CM

## 2015-02-17 LAB — WET PREP, GENITAL
Trich, Wet Prep: NONE SEEN
YEAST WET PREP: NONE SEEN

## 2015-02-17 LAB — URINALYSIS, ROUTINE W REFLEX MICROSCOPIC
Bilirubin Urine: NEGATIVE
Glucose, UA: NEGATIVE mg/dL
Ketones, ur: NEGATIVE mg/dL
Leukocytes, UA: NEGATIVE
NITRITE: NEGATIVE
PH: 7.5 (ref 5.0–8.0)
Protein, ur: NEGATIVE mg/dL
SPECIFIC GRAVITY, URINE: 1.02 (ref 1.005–1.030)
UROBILINOGEN UA: 1 mg/dL (ref 0.0–1.0)

## 2015-02-17 LAB — URINE MICROSCOPIC-ADD ON

## 2015-02-17 LAB — CBC
HEMATOCRIT: 38.1 % (ref 36.0–46.0)
Hemoglobin: 12.7 g/dL (ref 12.0–15.0)
MCH: 28.4 pg (ref 26.0–34.0)
MCHC: 33.3 g/dL (ref 30.0–36.0)
MCV: 85.2 fL (ref 78.0–100.0)
PLATELETS: 206 10*3/uL (ref 150–400)
RBC: 4.47 MIL/uL (ref 3.87–5.11)
RDW: 11.9 % (ref 11.5–15.5)
WBC: 4.3 10*3/uL (ref 4.0–10.5)

## 2015-02-17 LAB — POCT PREGNANCY, URINE: PREG TEST UR: NEGATIVE

## 2015-02-17 LAB — RPR: RPR: NONREACTIVE

## 2015-02-17 LAB — HIV ANTIBODY (ROUTINE TESTING W REFLEX): HIV SCREEN 4TH GENERATION: NONREACTIVE

## 2015-02-17 MED ORDER — METRONIDAZOLE 500 MG PO TABS: 500.0000 mg | ORAL_TABLET | Freq: Two times a day (BID) | ORAL | Status: DC

## 2015-02-17 NOTE — MAU Note (Signed)
Vaginal d/c since beginning of summer. Had culposcopy end of May and told everything ok. Now d/c has odor and is white. No itching. Had severe  abd pain last evening but is better now.

## 2015-02-17 NOTE — MAU Note (Signed)
Pt states she had a colpo in May. She began having thick, white discharge afterwards, and now it has an odor. Also c/o severe abdominal pain last night for 2 hours, but it is now gone. Last BM was yesterday, denies constipation.

## 2015-02-17 NOTE — MAU Provider Note (Signed)
History     CSN: [id number]  Arrival date and time: 02/17/15 4098   First Provider Initiated Contact with Patient 02/17/15 [phone]         Chief Complaint  Patient presents with  . Vaginal Discharge  . Abdominal Pain   HPI  Ann Knapp is a 29 y.o. female who presents for vaginal discharge.  Vaginal discharge since June after colposcopy.  Since last week, discharge has had foul odor.  Some spotting last week; states that's normal since she's been on depo.  Denies vaginal irritation.  Denies fever.  Denies post coital bleeding or dyspareunia.  Denies abdominal pain.  Desires STD testing.   OB History    Gravida Para Term Preterm AB TAB SAB Ectopic Multiple Living   0 0 2      History reviewed. No pertinent past medical history.  Past Surgical History  Procedure Laterality Date  . Hernia repair      Family History  Problem Relation Age of Onset  . Hypertension Mother     Social History  Substance Use Topics  . Smoking status: Never Smoker   . Smokeless tobacco: None  . Alcohol Use: No    Allergies: No Known Allergies  Prescriptions prior to admission  Medication Sig Dispense Refill Last Dose  . medroxyPROGESTERone (DEPO-PROVERA) 150 MG/ML injection Inject 150 mg into the muscle every 3 (three) months.   02/15/2015    Review of Systems  Constitutional: Negative.   Gastrointestinal: Negative.   Genitourinary: Negative for dysuria.       + vaginal discharge No vaginal bleeding   Physical Exam   Blood pressure 102/88, pulse 93, temperature 98.6 F (37 C), resp. rate 18, height  (1.575 m), weight 105 lb 6.4 oz (47.809 kg).  Physical Exam  Nursing note and vitals reviewed. Constitutional: She is oriented to person, place, and time. She appears well-developed and well-nourished. No distress.  HENT:  Head: Normocephalic and atraumatic.  Eyes: Conjunctivae are normal. Right eye exhibits no discharge. Left eye exhibits no discharge. No  scleral icterus.  Neck: Normal range of motion.  Cardiovascular: Normal rate, regular rhythm and normal heart sounds.   No murmur heard. Respiratory: Effort normal and breath sounds normal. No respiratory distress. She has no wheezes.  GI: Soft. Bowel sounds are normal. She exhibits no distension. There is no tenderness.  Genitourinary: Vagina normal. Cervix exhibits discharge (small amount of thin white discharge; mild odor). Cervix exhibits no motion tenderness and no friability.  Neurological: She is alert and oriented to person, place, and time.  Skin: Skin is warm and dry. She is not diaphoretic.  Psychiatric: She has a normal mood and affect. Her behavior is normal. Judgment and thought content normal.    MAU Course  Procedures Results for orders placed or performed during the hospital encounter of 02/17/15 (from the past 24 hour(s))  Urinalysis, Routine w reflex microscopic (not at [facility])     Status: Abnormal   Collection Time: 02/17/15  6:50 AM  Result Value Ref Range   Color, Urine YELLOW YELLOW   APPearance CLEAR CLEAR   Specific Gravity, Urine 1.020 1.005 - 1.030   pH 7.5 5.0 - 8.0   Glucose, UA NEGATIVE NEGATIVE mg/dL   Hgb urine dipstick TRACE (A) NEGATIVE   Bilirubin Urine NEGATIVE NEGATIVE   Ketones, ur NEGATIVE NEGATIVE mg/dL   Protein, ur NEGATIVE NEGATIVE mg/dL   Urobilinogen, UA 1.0 0.0 - 1.0 mg/dL  Nitrite NEGATIVE NEGATIVE   Leukocytes, UA NEGATIVE NEGATIVE  Urine microscopic-add on     Status: Abnormal   Collection Time: 02/17/15  6:50 AM  Result Value Ref Range   Squamous Epithelial / LPF FEW (A) RARE   RBC / HPF 0-2 <3 RBC/hpf   Urine-Other MUCOUS PRESENT   Pregnancy, urine POC     Status: None   Collection Time: 02/17/15  6:57 AM  Result Value Ref Range   Preg Test, Ur NEGATIVE NEGATIVE  Wet prep, genital     Status: Abnormal   Collection Time: 02/17/15  7:32 AM  Result Value Ref Range   Yeast Wet Prep HPF POC NONE SEEN NONE SEEN   Trich, Wet Prep  NONE SEEN NONE SEEN   Clue Cells Wet Prep HPF POC MANY (A) NONE SEEN   WBC, Wet Prep HPF POC RARE (A) NONE SEEN  CBC     Status: None   Collection Time: 02/17/15  7:40 AM  Result Value Ref Range   WBC 4.3 4.0 - 10.5 K/uL   RBC 4.47 3.87 - 5.11 MIL/uL   Hemoglobin 12.7 12.0 - 15.0 g/dL   HCT 29.5 28.4 - 13.2 %   MCV 85.2 78.0 - 100.0 fL   MCH 28.4 26.0 - 34.0 pg   MCHC 33.3 30.0 - 36.0 g/dL   RDW 44.0 10.2 - 72.5 %   Platelets 206 150 - 400 K/uL    MDM STD testing per patient request  Assessment and Plan  A: 1. BV (bacterial vaginosis)   2. Screening for STDs (sexually transmitted diseases)    P: Discharge home Rx flagyl - no alcohol while taking GC/CT, HIV, & RPR pending F/u in [facility] for routine care or as needed  Judeth Horn, NP  02/17/2015, 7:22 AM

## 2015-02-17 NOTE — Discharge Instructions (Signed)

## 2015-02-18 LAB — GC/CHLAMYDIA PROBE AMP (~~LOC~~) NOT AT ARMC
Chlamydia: NEGATIVE
NEISSERIA GONORRHEA: NEGATIVE

## 2016-06-14 ENCOUNTER — Encounter (HOSPITAL_COMMUNITY): Payer: Self-pay

## 2016-06-14 ENCOUNTER — Ambulatory Visit (HOSPITAL_COMMUNITY)
Admission: EM | Admit: 2016-06-14 | Discharge: 2016-06-14 | Disposition: A | Discharge status: Home / Self Care | Payer: Managed Care, Other (non HMO) | Attending: Family Medicine | Admitting: Family Medicine

## 2016-06-14 ENCOUNTER — Inpatient Hospital Stay (HOSPITAL_COMMUNITY): Payer: Managed Care, Other (non HMO)

## 2016-06-14 ENCOUNTER — Inpatient Hospital Stay (HOSPITAL_COMMUNITY)
Admission: AD | Admit: 2016-06-14 | Discharge: 2016-06-14 | Disposition: A | Discharge status: Home / Self Care | Payer: Managed Care, Other (non HMO) | Source: Ambulatory Visit | Attending: Obstetrics & Gynecology | Admitting: Obstetrics & Gynecology

## 2016-06-14 ENCOUNTER — Encounter (HOSPITAL_COMMUNITY): Payer: Self-pay | Admitting: Emergency Medicine

## 2016-06-14 DIAGNOSIS — Z3A01 Less than 8 weeks gestation of pregnancy: Secondary | ICD-10-CM | POA: Insufficient documentation

## 2016-06-14 DIAGNOSIS — N760 Acute vaginitis: Secondary | ICD-10-CM | POA: Diagnosis not present

## 2016-06-14 DIAGNOSIS — R1032 Left lower quadrant pain: Secondary | ICD-10-CM | POA: Diagnosis not present

## 2016-06-14 DIAGNOSIS — Z8249 Family history of ischemic heart disease and other diseases of the circulatory system: Secondary | ICD-10-CM | POA: Insufficient documentation

## 2016-06-14 DIAGNOSIS — R110 Nausea: Secondary | ICD-10-CM | POA: Diagnosis not present

## 2016-06-14 DIAGNOSIS — O23591 Infection of other part of genital tract in pregnancy, first trimester: Secondary | ICD-10-CM | POA: Insufficient documentation

## 2016-06-14 DIAGNOSIS — Z3491 Encounter for supervision of normal pregnancy, unspecified, first trimester: Secondary | ICD-10-CM

## 2016-06-14 DIAGNOSIS — Z3201 Encounter for pregnancy test, result positive: Secondary | ICD-10-CM

## 2016-06-14 DIAGNOSIS — R102 Pelvic and perineal pain: Secondary | ICD-10-CM

## 2016-06-14 DIAGNOSIS — R42 Dizziness and giddiness: Secondary | ICD-10-CM

## 2016-06-14 DIAGNOSIS — B9689 Other specified bacterial agents as the cause of diseases classified elsewhere: Secondary | ICD-10-CM | POA: Diagnosis not present

## 2016-06-14 DIAGNOSIS — R109 Unspecified abdominal pain: Secondary | ICD-10-CM

## 2016-06-14 DIAGNOSIS — O219 Vomiting of pregnancy, unspecified: Secondary | ICD-10-CM

## 2016-06-14 DIAGNOSIS — O26891 Other specified pregnancy related conditions, first trimester: Secondary | ICD-10-CM | POA: Diagnosis not present

## 2016-06-14 DIAGNOSIS — N898 Other specified noninflammatory disorders of vagina: Secondary | ICD-10-CM

## 2016-06-14 LAB — CBC
HEMATOCRIT: 36.4 % (ref 36.0–46.0)
Hemoglobin: 12.5 g/dL (ref 12.0–15.0)
MCH: 28.4 pg (ref 26.0–34.0)
MCHC: 34.3 g/dL (ref 30.0–36.0)
MCV: 82.7 fL (ref 78.0–100.0)
Platelets: 189 10*3/uL (ref 150–400)
RBC: 4.4 MIL/uL (ref 3.87–5.11)
RDW: 11.8 % (ref 11.5–15.5)
WBC: 7 10*3/uL (ref 4.0–10.5)

## 2016-06-14 LAB — POCT URINALYSIS DIP (DEVICE)
BILIRUBIN URINE: NEGATIVE
GLUCOSE, UA: NEGATIVE mg/dL
Hgb urine dipstick: NEGATIVE
KETONES UR: NEGATIVE mg/dL
LEUKOCYTES UA: NEGATIVE
Nitrite: NEGATIVE
Protein, ur: NEGATIVE mg/dL
SPECIFIC GRAVITY, URINE: 1.02 (ref 1.005–1.030)
Urobilinogen, UA: 1 mg/dL (ref 0.0–1.0)
pH: 7 (ref 5.0–8.0)

## 2016-06-14 LAB — WET PREP, GENITAL
SPERM: NONE SEEN
TRICH WET PREP: NONE SEEN
YEAST WET PREP: NONE SEEN

## 2016-06-14 LAB — POCT PREGNANCY, URINE: PREG TEST UR: POSITIVE — AB

## 2016-06-14 LAB — HCG, QUANTITATIVE, PREGNANCY: HCG, BETA CHAIN, QUANT, S: 107268 m[IU]/mL — AB (ref <5)

## 2016-06-14 MED ORDER — METRONIDAZOLE 500 MG PO TABS: 500.0000 mg | ORAL_TABLET | Freq: Two times a day (BID) | ORAL | 0 refills | Status: AC

## 2016-06-14 MED ORDER — ONDANSETRON HCL 4 MG PO TABS
4.0000 mg | ORAL_TABLET | Freq: Once | ORAL | Status: AC
  Administered 2016-06-14: 4 mg via ORAL
  Filled 2016-06-14: qty 1

## 2016-06-14 MED ORDER — VITAMIN B-6 25 MG PO TABS: 25.0000 mg | ORAL_TABLET | Freq: Three times a day (TID) | ORAL | 1 refills | Status: DC | PRN

## 2016-06-14 MED ORDER — DOXYLAMINE SUCCINATE (SLEEP) 25 MG PO TABS: 25.0000 mg | ORAL_TABLET | Freq: Three times a day (TID) | ORAL | 0 refills | Status: DC | PRN

## 2016-06-14 NOTE — Discharge Instructions (Signed)

## 2016-06-14 NOTE — MAU Provider Note (Signed)
Patient Ann Knapp is a 31 year old [id number] here with complaints of nausea, suprapubic pain and dizziness that started 2 days ago. She has not been throwing up.  History     CSN: [id number]  Arrival date and time: 06/14/16 1843   None     Chief Complaint  Patient presents with  . Abdominal Pain  . Morning Sickness  . Vaginal Discharge  . Dizziness   Abdominal Pain  This is a new problem. The current episode started in the past 7 days. The onset quality is sudden. The problem occurs intermittently. The problem has been gradually worsening. The pain is located in the suprapubic region. The pain is at a severity of 6/10. The pain is moderate. The quality of the pain is cramping. The abdominal pain does not radiate. Associated symptoms include nausea. Pertinent negatives include no anorexia, arthralgias, belching, constipation, diarrhea, dysuria, fever, flatus, frequency, headaches, hematochezia, hematuria, melena, myalgias or vomiting. Exacerbated by: stomach pains are worse when she lies down. The pain is relieved by nothing. Treatments tried: tried ibuprofen but it did not help. The treatment provided no relief.  Vaginal Discharge  The patient's primary symptoms include a genital odor and vaginal discharge. The patient's pertinent negatives include no genital itching, genital lesions, genital rash, missed menses, pelvic pain or vaginal bleeding. This is a new problem. The current episode started in the past 7 days. The problem occurs constantly. Progression since onset: now she has a smell with her discharge. Associated symptoms include abdominal pain and nausea. Pertinent negatives include no anorexia, constipation, diarrhea, dysuria, fever, frequency, headaches, hematuria or vomiting. The vaginal discharge was white and thick. There has been no bleeding. She has not been passing clots. She has not been passing tissue.   Her nausea is constant; she feels worse after she eats and is forcing  herself to eat. Today she has had a sausage dog, cabbage, macaroni and rice. She did not have lunch.   OB History    Gravida Para Term Preterm AB Living   4 2 2   1 2    SAB TAB Ectopic Multiple Live Births     1 0 0 2      History reviewed. No pertinent past medical history.  Past Surgical History:  Procedure Laterality Date  . HERNIA REPAIR      Family History  Problem Relation Age of Onset  . Hypertension Mother     Social History  Substance Use Topics  . Smoking status: Never Smoker  . Smokeless tobacco: Not on file  . Alcohol use No    Allergies: No Known Allergies  No prescriptions prior to admission.    Review of Systems  Constitutional: Negative for fever.  HENT: Negative.   Eyes: Negative.   Respiratory: Negative.   Cardiovascular: Negative.   Gastrointestinal: Positive for abdominal pain and nausea. Negative for anorexia, constipation, diarrhea, flatus, hematochezia, melena and vomiting.  Endocrine: Negative.   Genitourinary: Positive for vaginal discharge. Negative for dysuria, frequency, hematuria, missed menses, pelvic pain, vaginal bleeding and vaginal pain.  Musculoskeletal: Negative for arthralgias and myalgias.  Neurological: Positive for dizziness. Negative for headaches.  Hematological: Negative.   Psychiatric/Behavioral: Negative.    Physical Exam   Blood pressure 124/74, pulse 77, temperature 97.7 F (36.5 C), height 5\' 2"  (1.575 m), weight 106 lb (48.1 kg), last menstrual period 04/22/2016.  Physical Exam  Constitutional: She is oriented to person, place, and time. She appears well-developed and well-nourished.  HENT:  Head:  Normocephalic.  Neck: Normal range of motion.  Respiratory: Effort normal. No respiratory distress.  GI: Soft. Bowel sounds are normal.  Genitourinary:  Genitourinary Comments: NEFG; no lesions on vaginal walls. Cervix is pink with no lesions; no CMT. No cervical motion tenderness or suprapubic tenderness.    Neurological: She is alert and oriented to person, place, and time.  Skin: Skin is warm and dry.    MAU Course  Procedures  MDM -UA -CBC, ABO, beta -US -wet prep  Charlesetta GaribaldiKathryn Lorraine Kooistra 06/14/2016, 7:20 PM   Assumed care of patient at 815PM, re-asses patient at 845PM. Patient reports signficant improvement in nausea. No more abdominal pain. Asking to be d/c home. D/W Dr. Sallye OberKulwa who agreed with D/C.  Assessment and Plan  #1: Nausea of pregnancy: patient has phenergan at home. Will prescribe B6 and unisom.  #2: BV: prescribe Flagyl 500mg  BID #3: IUP with HR 107. Advised continue to follow with her OB   Ernestina PennaNicholas Latese Dufault, MD 06/14/2016 2130

## 2016-06-14 NOTE — MAU Note (Signed)
[facility], presents with stomach pain, nausea, vaginal discharge with odor and dizziness x 4 days.

## 2016-06-14 NOTE — Discharge Instructions (Signed)
Due to your being pregnant, it is important to rule out an ectopic pregnancy. We do not have ultrasound equipment here that can do that.  I recommend you go to the emergency room to rule this condition out. The ER will have access to this chart and be able to read the notes of this encounter.

## 2016-06-14 NOTE — ED Provider Notes (Signed)
CSN: [id number]     Arrival date & time 06/14/16  1650 History   First MD Initiated Contact with Patient 06/14/16 1759     Chief Complaint  Patient presents with  . Abdominal Cramping   (Consider location/radiation/quality/duration/timing/severity/associated sxs/prior Treatment) 31 year old female presents to clinic with 5 day history of nausea and lower abdominal cramps. She reports she took a pregnancy test at home that was positive. Her last menstrual period was December 20th, she has not had any prenatal care yet. She is G3,P2, A1, 2 children alive and well, full term without complications. She has had no bleeding or spotting, no pelvic pain, does have a white discharge with odor, no itching or other complaints   The history is provided by the patient.  Abdominal Cramping     History reviewed. No pertinent past medical history. Past Surgical History:  Procedure Laterality Date  . HERNIA REPAIR     Family History  Problem Relation Age of Onset  . Hypertension Mother    Social History  Substance Use Topics  . Smoking status: Never Smoker  . Smokeless tobacco: Not on file  . Alcohol use No   OB History    Gravida Para Term Preterm AB Living   3 2 2   1 2    SAB TAB Ectopic Multiple Live Births     1 0 0 2     Review of Systems  Reason unable to perform ROS: as covered in HPI.  All other systems reviewed and are negative.   Allergies  Patient has no known allergies.  Home Medications   Prior to Admission medications   Not on File   Meds Ordered and Administered this Visit  Medications - No data to display  BP 115/65 (BP Location: Right Arm)   Pulse 95   Temp 98.2 F (36.8 C) (Oral)   Resp 16   LMP 04/29/2016 (Exact Date)   SpO2 100%  No data found.   Physical Exam  Constitutional: She is oriented to person, place, and time. She appears well-developed and well-nourished. No distress.  HENT:  Head: Normocephalic and atraumatic.  Cardiovascular: Normal  rate and regular rhythm.   Pulmonary/Chest: Effort normal and breath sounds normal.  Abdominal: Soft. Bowel sounds are normal. There is tenderness in the left lower quadrant. There is no rigidity, no rebound, no guarding, no CVA tenderness, no tenderness at McBurney's point and negative Murphy's sign.  Genitourinary:  Genitourinary Comments: Deferred   Neurological: She is alert and oriented to person, place, and time.  Skin: Skin is warm and dry. Capillary refill takes less than 2 seconds. She is not diaphoretic.  Psychiatric: She has a normal mood and affect.  Nursing note and vitals reviewed.   Urgent Care Course   Clinical Course as of Jun 15 1815  Wynelle Link Jun 14, 2016  1759 Preg Test, Ur: (!) POSITIVE [LK]    Clinical Course User Index [LK] Dorena Bodo, NP    Procedures (including critical care time)  Labs Review Labs Reviewed  POCT PREGNANCY, URINE - Abnormal; Notable for the following:       Result Value   Preg Test, Ur POSITIVE (*)    All other components within normal limits  POCT URINALYSIS DIP (DEVICE)    Imaging Review No results found.   Visual Acuity Review  Right Eye Distance:   Left Eye Distance:   Bilateral Distance:    Right Eye Near:   Left Eye Near:  Bilateral Near:         MDM   1. Less than [redacted] weeks gestation of pregnancy   2. Pelvic pain in female   Due to your being pregnant, it is important to rule out an ectopic pregnancy. We do not have ultrasound equipment here that can do that.  I recommend you go to the emergency room to rule this condition out. The ER will have access to this chart and be able to read the notes of this encounter.     Dorena BodoLawrence Masato Pettie, NP 06/14/16 Rickey [id number]

## 2016-06-14 NOTE — ED Triage Notes (Signed)
[facility] with a complaint of abdominal pain and cramping and N/V x 4 days. The patient stated that she did take a pregnancy test at home that was positive.

## 2016-06-15 LAB — GC/CHLAMYDIA PROBE AMP (~~LOC~~) NOT AT ARMC
CHLAMYDIA, DNA PROBE: NEGATIVE
Neisseria Gonorrhea: NEGATIVE

## 2016-06-15 LAB — ABO/RH: ABO/RH(D): O POS

## 2017-04-21 ENCOUNTER — Encounter (HOSPITAL_COMMUNITY): Payer: Self-pay

## 2017-08-16 ENCOUNTER — Other Ambulatory Visit: Payer: Self-pay

## 2017-08-16 ENCOUNTER — Encounter (HOSPITAL_BASED_OUTPATIENT_CLINIC_OR_DEPARTMENT_OTHER): Payer: Self-pay | Admitting: *Deleted

## 2017-08-16 ENCOUNTER — Emergency Department (HOSPITAL_BASED_OUTPATIENT_CLINIC_OR_DEPARTMENT_OTHER)
Admission: EM | Admit: 2017-08-16 | Discharge: 2017-08-16 | Disposition: A | Discharge status: Home / Self Care | Payer: 59 | Attending: Emergency Medicine | Admitting: Emergency Medicine

## 2017-08-16 ENCOUNTER — Emergency Department (HOSPITAL_BASED_OUTPATIENT_CLINIC_OR_DEPARTMENT_OTHER): Payer: 59

## 2017-08-16 DIAGNOSIS — R05 Cough: Secondary | ICD-10-CM | POA: Diagnosis present

## 2017-08-16 DIAGNOSIS — J40 Bronchitis, not specified as acute or chronic: Secondary | ICD-10-CM

## 2017-08-16 DIAGNOSIS — I10 Essential (primary) hypertension: Secondary | ICD-10-CM | POA: Insufficient documentation

## 2017-08-16 DIAGNOSIS — J209 Acute bronchitis, unspecified: Secondary | ICD-10-CM | POA: Insufficient documentation

## 2017-08-16 MED ORDER — ALBUTEROL SULFATE HFA 108 (90 BASE) MCG/ACT IN AERS
2.0000 | INHALATION_SPRAY | RESPIRATORY_TRACT | Status: DC | PRN
  Administered 2017-08-16: 2 via RESPIRATORY_TRACT
  Filled 2017-08-16: qty 6.7

## 2017-08-16 MED ORDER — AEROCHAMBER PLUS W/MASK MISC
1.0000 | Freq: Once | Status: AC
  Administered 2017-08-16: 1
  Filled 2017-08-16: qty 1

## 2017-08-16 NOTE — ED Provider Notes (Addendum)
[facility] EMERGENCY DEPARTMENT Provider Note   CSN: [id number] Arrival date & time: 08/16/17  1402     History   Chief Complaint Chief Complaint  Patient presents with  . Cough    HPI Ann Knapp is a 32 y.o. female.  HPI complains of cough for 1 month.  Sometimes has productive cough of greenish sputum in the mornings.  She presents today she developed chest pain anterior with coughing only 3 days ago.  She denies any fever denies shortness of breath.  Treated with over-the-counter cough medicine, without relief.  No other associated symptom.  Chest pain improved when not coughing History reviewed. No pertinent past medical history.  There are no active problems to display for this patient.   Past Surgical History:  Procedure Laterality Date  . HERNIA REPAIR       OB History    Gravida  4   Para  2   Term  2   Preterm      AB  1   Living  2     SAB      TAB  1   Ectopic  0   Multiple  0   Live Births  2            Home Medications    Prior to Admission medications   Medication Sig Start Date End Date Taking? Authorizing Provider  doxylamine, Sleep, (UNISOM) 25 MG tablet Take 1 tablet (25 mg total) by mouth 3 (three) times daily as needed (nausea). 06/14/16   Lorne SkeensSchenk, Nicholas Michael, MD  vitamin B-6 (PYRIDOXINE) 25 MG tablet Take 1 tablet (25 mg total) by mouth 3 (three) times daily as needed (nausea). 06/14/16   Lorne SkeensSchenk, Nicholas Michael, MD   Depo-Provera Family History Family History  Problem Relation Age of Onset  . Hypertension Mother     Social History Social History   Tobacco Use  . Smoking status: Never Smoker  . Smokeless tobacco: Never Used  Substance Use Topics  . Alcohol use: No  . Drug use: No   Occasional alcohol use no drug use  Allergies   Patient has no known allergies.   Review of Systems Review of Systems  Respiratory: Positive for cough.   Cardiovascular: Positive for chest pain.    Genitourinary:       Amenorrheic  All other systems reviewed and are negative.    Physical Exam Updated Vital Signs BP 138/82 (BP Location: Left Arm)   Pulse 98   Temp 99.3 F (37.4 C) (Oral)   Resp 16   Ht 5\' 2"  (1.575 m)   Wt 52.2 kg (115 lb)   SpO2 100%   BMI 21.03 kg/m   Physical Exam  Constitutional: She appears well-developed and well-nourished.  HENT:  Head: Normocephalic and atraumatic.  Eyes: Pupils are equal, round, and reactive to light. Conjunctivae are normal.  Neck: Neck supple. No tracheal deviation present. No thyromegaly present.  Cardiovascular: Normal rate and regular rhythm.  No murmur heard. Pulmonary/Chest: Effort normal and breath sounds normal.  Abdominal: Soft. Bowel sounds are normal. She exhibits no distension. There is no tenderness.  Musculoskeletal: Normal range of motion. She exhibits no edema or tenderness.  Neurological: She is alert. Coordination normal.  Skin: Skin is warm and dry. No rash noted.  Psychiatric: She has a normal mood and affect.  Nursing note and vitals reviewed.    ED Treatments / Results  Labs (all labs ordered are listed, but only abnormal  results are displayed) Labs Reviewed - No data to display  EKG None  Radiology No results found.  Procedures Procedures (including critical care time)  Medications Ordered in ED Medications - No data to display Results for orders placed or performed during the hospital encounter of 06/14/16  Wet prep, genital  Result Value Ref Range   Yeast Wet Prep HPF POC NONE SEEN NONE SEEN   Trich, Wet Prep NONE SEEN NONE SEEN   Clue Cells Wet Prep HPF POC PRESENT (A) NONE SEEN   WBC, Wet Prep HPF POC FEW (A) NONE SEEN   Sperm NONE SEEN   CBC  Result Value Ref Range   WBC 7.0 4.0 - 10.5 K/uL   RBC 4.40 3.87 - 5.11 MIL/uL   Hemoglobin 12.5 12.0 - 15.0 g/dL   HCT 16.1 09.6 - 04.5 %   MCV 82.7 78.0 - 100.0 fL   MCH 28.4 26.0 - 34.0 pg   MCHC 34.3 30.0 - 36.0 g/dL   RDW 40.9  81.1 - 91.4 %   Platelets 189 150 - 400 K/uL  hCG, quantitative, pregnancy  Result Value Ref Range   hCG, Beta Chain, Quant, S 107,268 (H) <5 mIU/mL  ABO/Rh  Result Value Ref Range   ABO/RH(D) O POS   GC/Chlamydia probe amp ([place])not at [facility]  Result Value Ref Range   Chlamydia Negative    Neisseria gonorrhea Negative    Dg Chest 2 View  Result Date: 08/16/2017 CLINICAL DATA:  Productive cough, chest pain. EXAM: CHEST - 2 VIEW COMPARISON:  None. FINDINGS: The heart size and mediastinal contours are within normal limits. Both lungs are clear. No pneumothorax or pleural effusion is noted. The visualized skeletal structures are unremarkable. IMPRESSION: No active cardiopulmonary disease. Electronically Signed   By: Lupita Raider, M.D.   On: 08/16/2017 16:59    Initial Impression / Assessment and Plan / ED Course  I have reviewed the triage vital signs and the nursing notes.  Pertinent labs & imaging results that were available during my care of the patient were reviewed by me and considered in my medical decision making (see chart for details).     chestX-ray viewed by me An albuterol HFA with spacer to go to use 2 puffs every 4 hours as needed for cough or shortness of breath.  Referral primary care if not better in 2 or 3 weeks Final Clinical Impressions(s) / ED Diagnoses  Dx #1acute bronchitis Final diagnoses:  None  #2 elevated blood pressure  ED Discharge Orders    None       Doug Sou, MD 08/16/17 1734    Doug Sou, MD 08/16/17 1734    Doug Sou, MD 08/16/17 1736

## 2017-08-16 NOTE — Discharge Instructions (Addendum)
Use your albuterol inhaler 2 puffs every 4 hours as needed for cough or shortness of breath.  Call the number on these instructions to get a primary care physician and to arrange to be seen if not feeling better in 2 or 3 weeks.  Your blood pressure should be rechecked within the next 3 weeks.  Today's was mildly elevated at 138/82

## 2017-08-16 NOTE — ED Triage Notes (Signed)
Cough with pain in her chest when she coughs x 1 month.

## 2017-08-16 NOTE — ED Notes (Signed)
ED Provider at bedside. 

## 2018-02-09 IMAGING — US US OB TRANSVAGINAL
1 series · 15 of 28 positions shown · non-contrast
Comparison: None.

CLINICAL DATA: 30-year-old pregnant female presents with several
days of cramping. Uncertain LMP. Quantitative beta HCG is pending.

EXAM:
OBSTETRIC <14 WK US AND TRANSVAGINAL OB US
TECHNIQUE: Both transabdominal and transvaginal ultrasound examinations were
performed for complete evaluation of the gestation as well as the
maternal uterus, adnexal regions, and pelvic cul-de-sac.
Transvaginal technique was performed to assess early pregnancy.

[Series 1: us ob transvaginal · 15 of 78 slices shown]
[im 1/78]
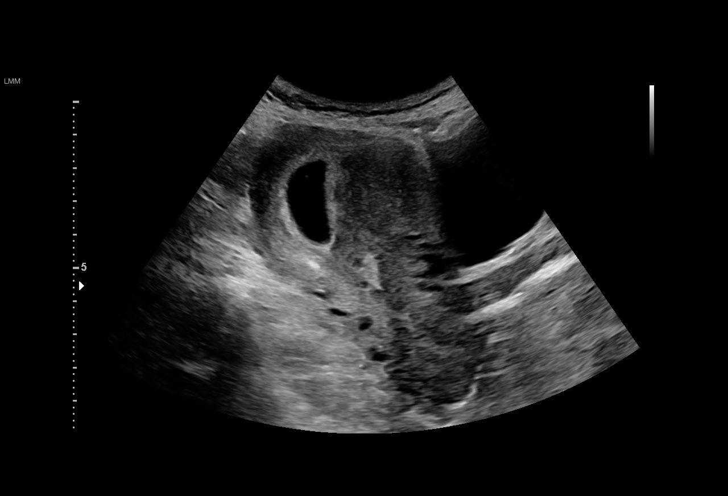
[im 6/78]
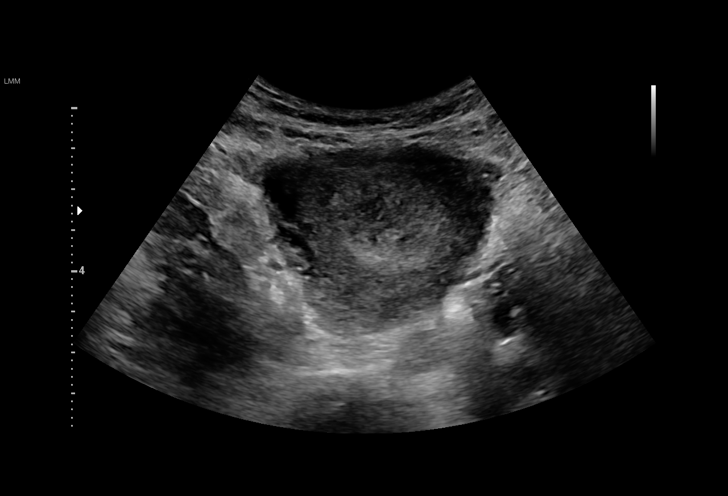
[im 12/78]
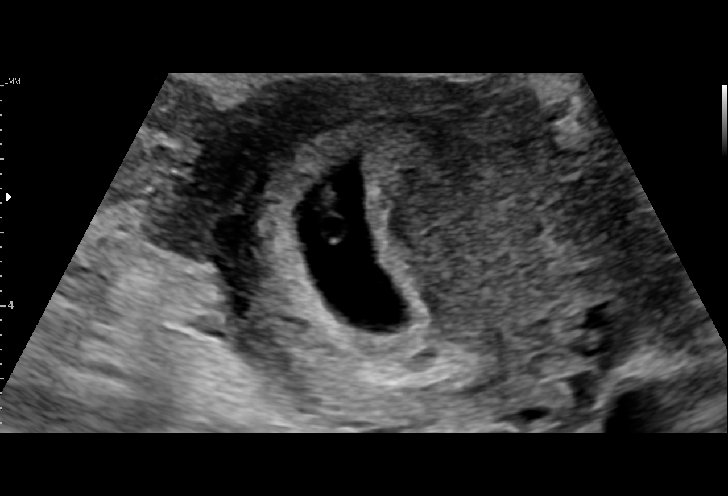
[im 18/78]
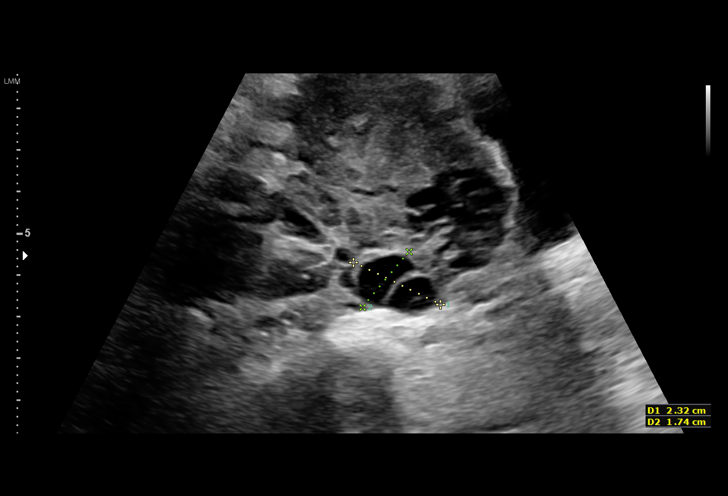
[im 23/78]
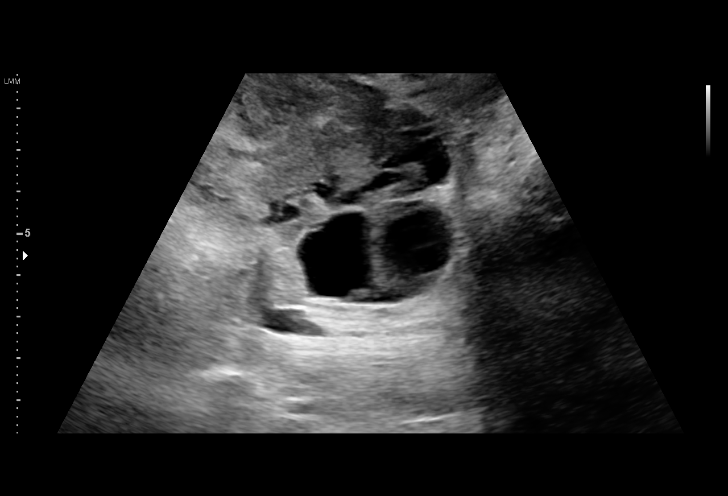
[im 29/78]
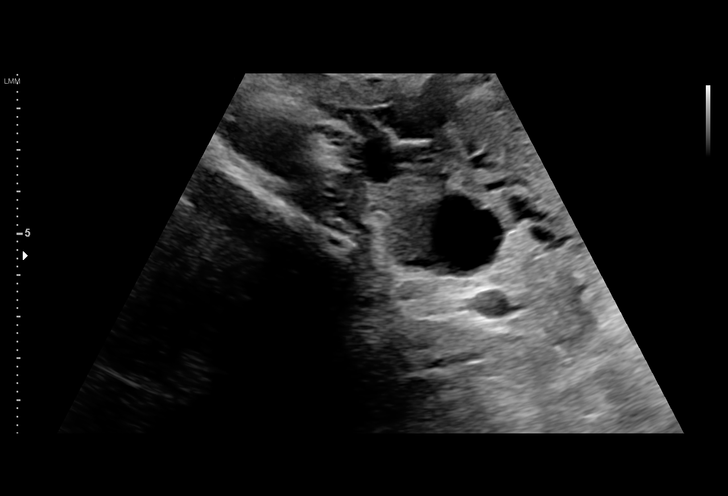
[im 35/78]
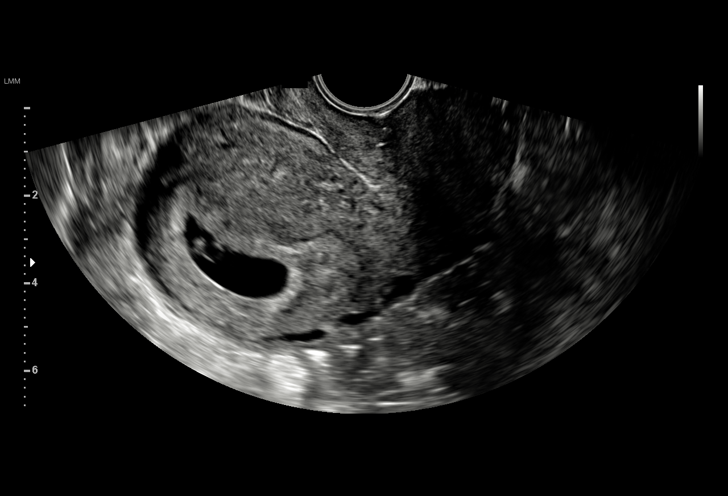
[im 40/78]
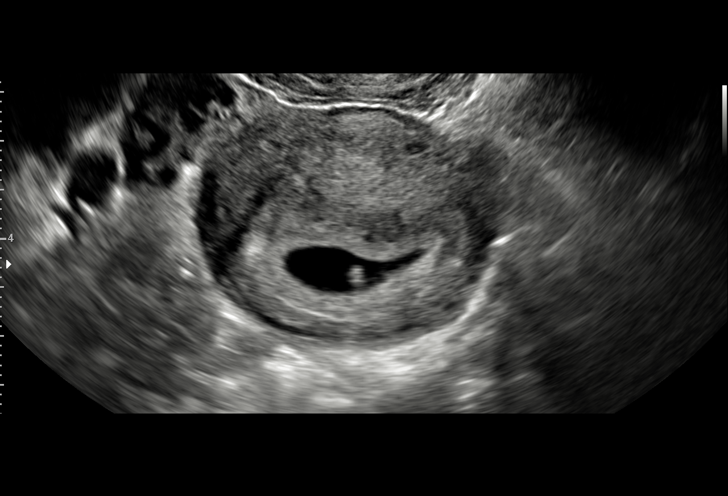
[im 43/78]
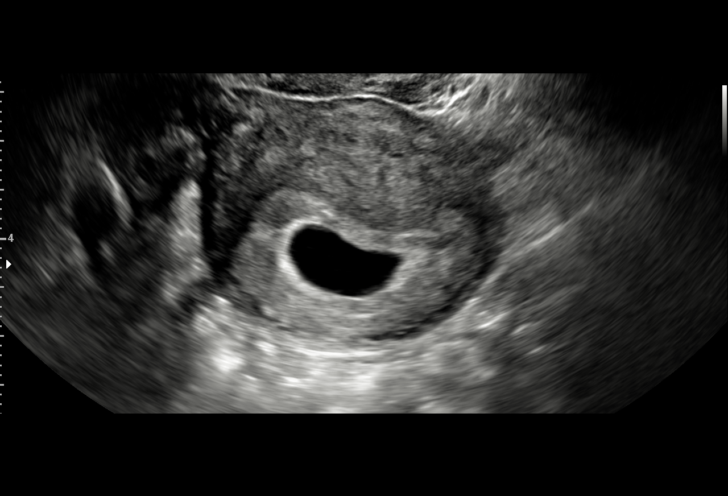
[im 49/78]
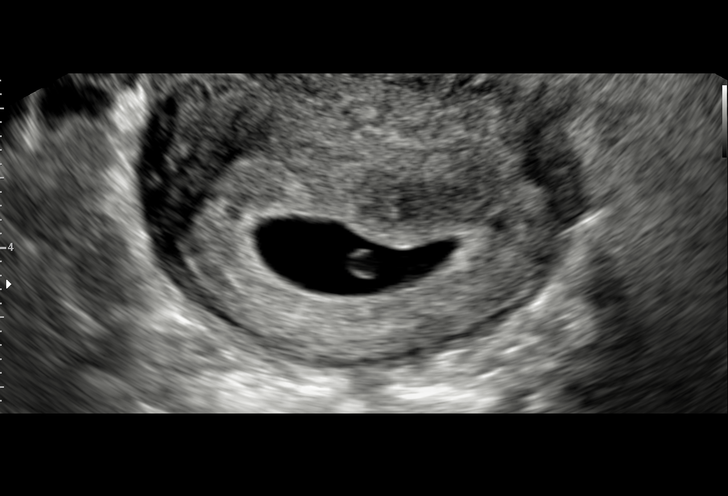
[im 55/78]
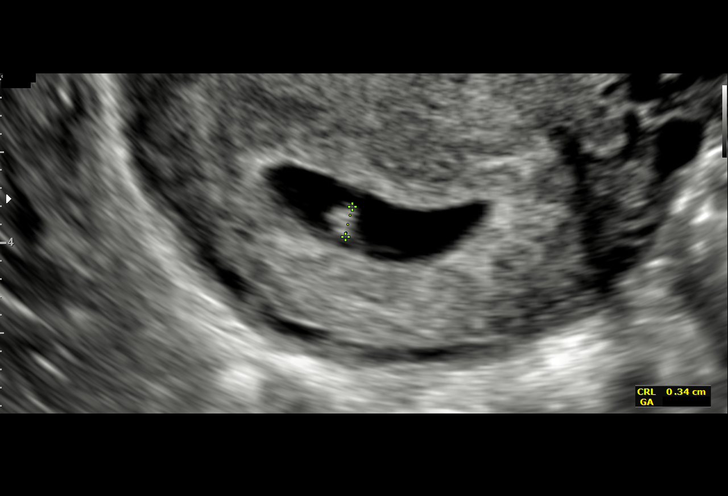
[im 60/78]
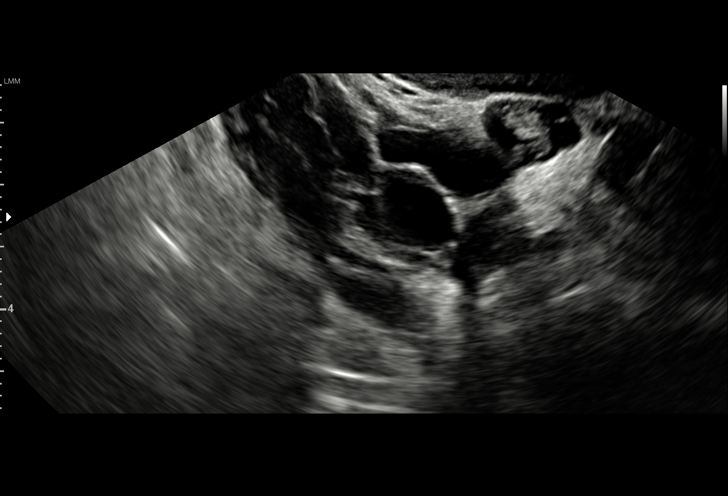
[im 66/78]
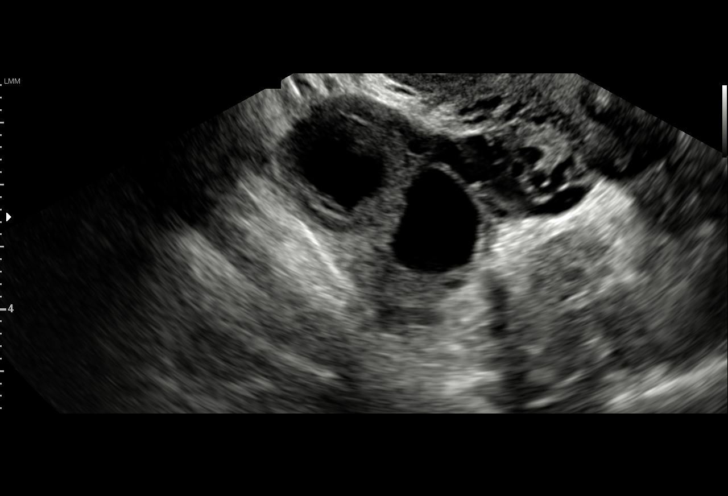
[im 72/78]
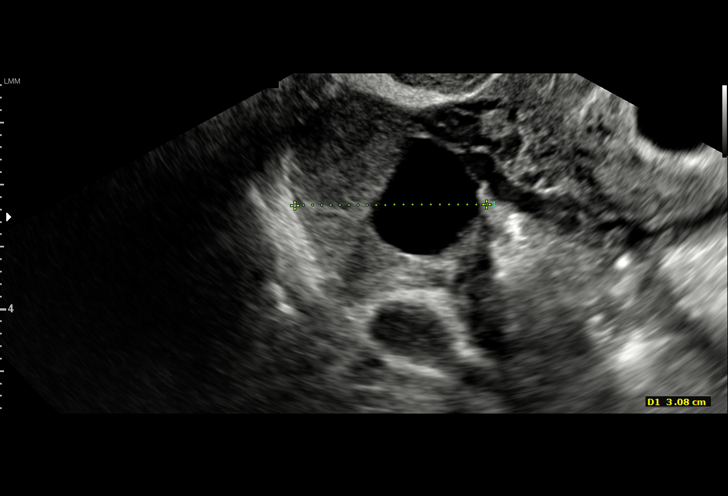
[im 78/78]
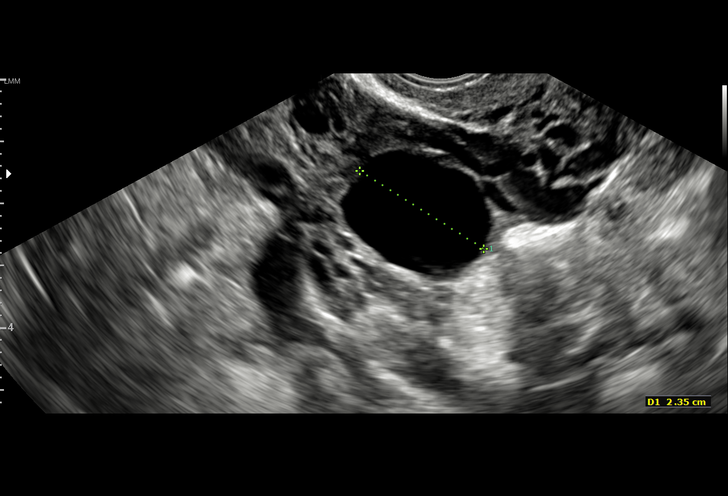

[15 of 28 positions shown; findings below may reference images not displayed]

FINDINGS: Intrauterine gestational sac: Single intrauterine gestational sac
appears normal in size, shape and position.

Yolk sac:  Visualized.

Embryo:  Visualized.

Embryonic Cardiac Activity: Visualized.

Embryonic Heart Rate: 107  bpm

CRL:  3.3  mm   5 w   6 d                  US EDC: 02/08/2017

Subchorionic hemorrhage:  None visualized.

Maternal uterus/adnexae: Anteverted uterus. No uterine fibroids are
demonstrated. Left ovary measures 2.6 x 1.6 x 1.9 cm. Right ovary
measures 3.8 x 2.7 x 3.1 cm and contains a corpus luteum. No
suspicious ovarian or adnexal masses. No abnormal free fluid in the
pelvis.
IMPRESSION: Single living intrauterine gestation at 5 weeks 6 days by crown-rump
length. Embryonic heart rate 107 bpm is within normal limits for
this early gestational age. No acute early first-trimester
gestational abnormality.

## 2018-11-16 ENCOUNTER — Telehealth: Payer: Self-pay | Admitting: General Practice

## 2018-11-16 NOTE — Telephone Encounter (Signed)
Pt would like to sched appt for COVID testing. Pt is expericing loss of taste and smell.  Please call pt to sched:  [phone]

## 2018-11-17 NOTE — Telephone Encounter (Addendum)
Pt does not have a pcp that I can see in the system. Need to verify if pt has pcp, if not pt needs to contact local [facility] or be given drive up site information.  Attempted to reach pt no answer. Left vm to call back

## 2019-04-13 IMAGING — DX DG CHEST 2V
2 series · 2 of 2 positions shown · non-contrast
Comparison: None.

CLINICAL DATA: Productive cough, chest pain.

EXAM:
CHEST - 2 VIEW

[chest pa]
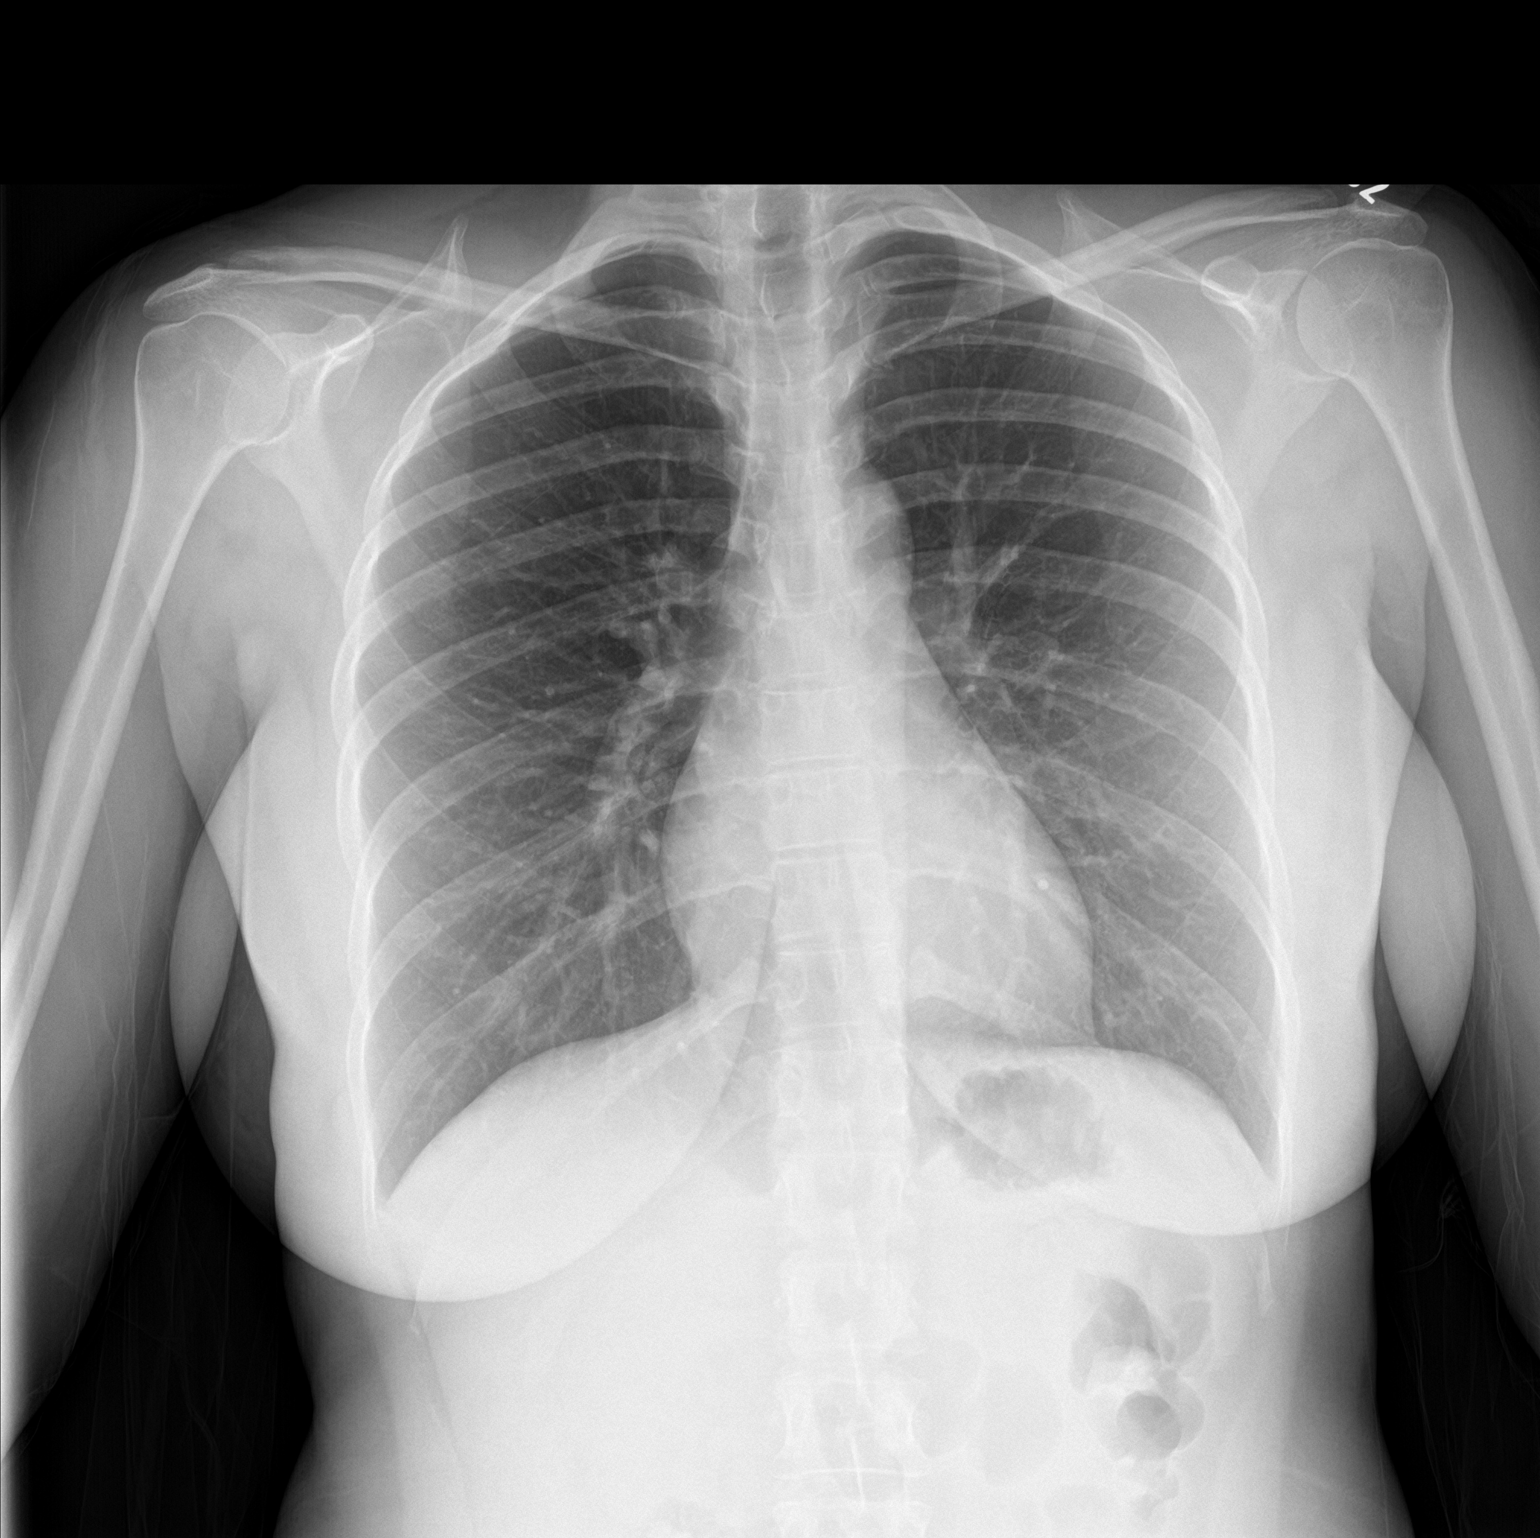

[chest lat]
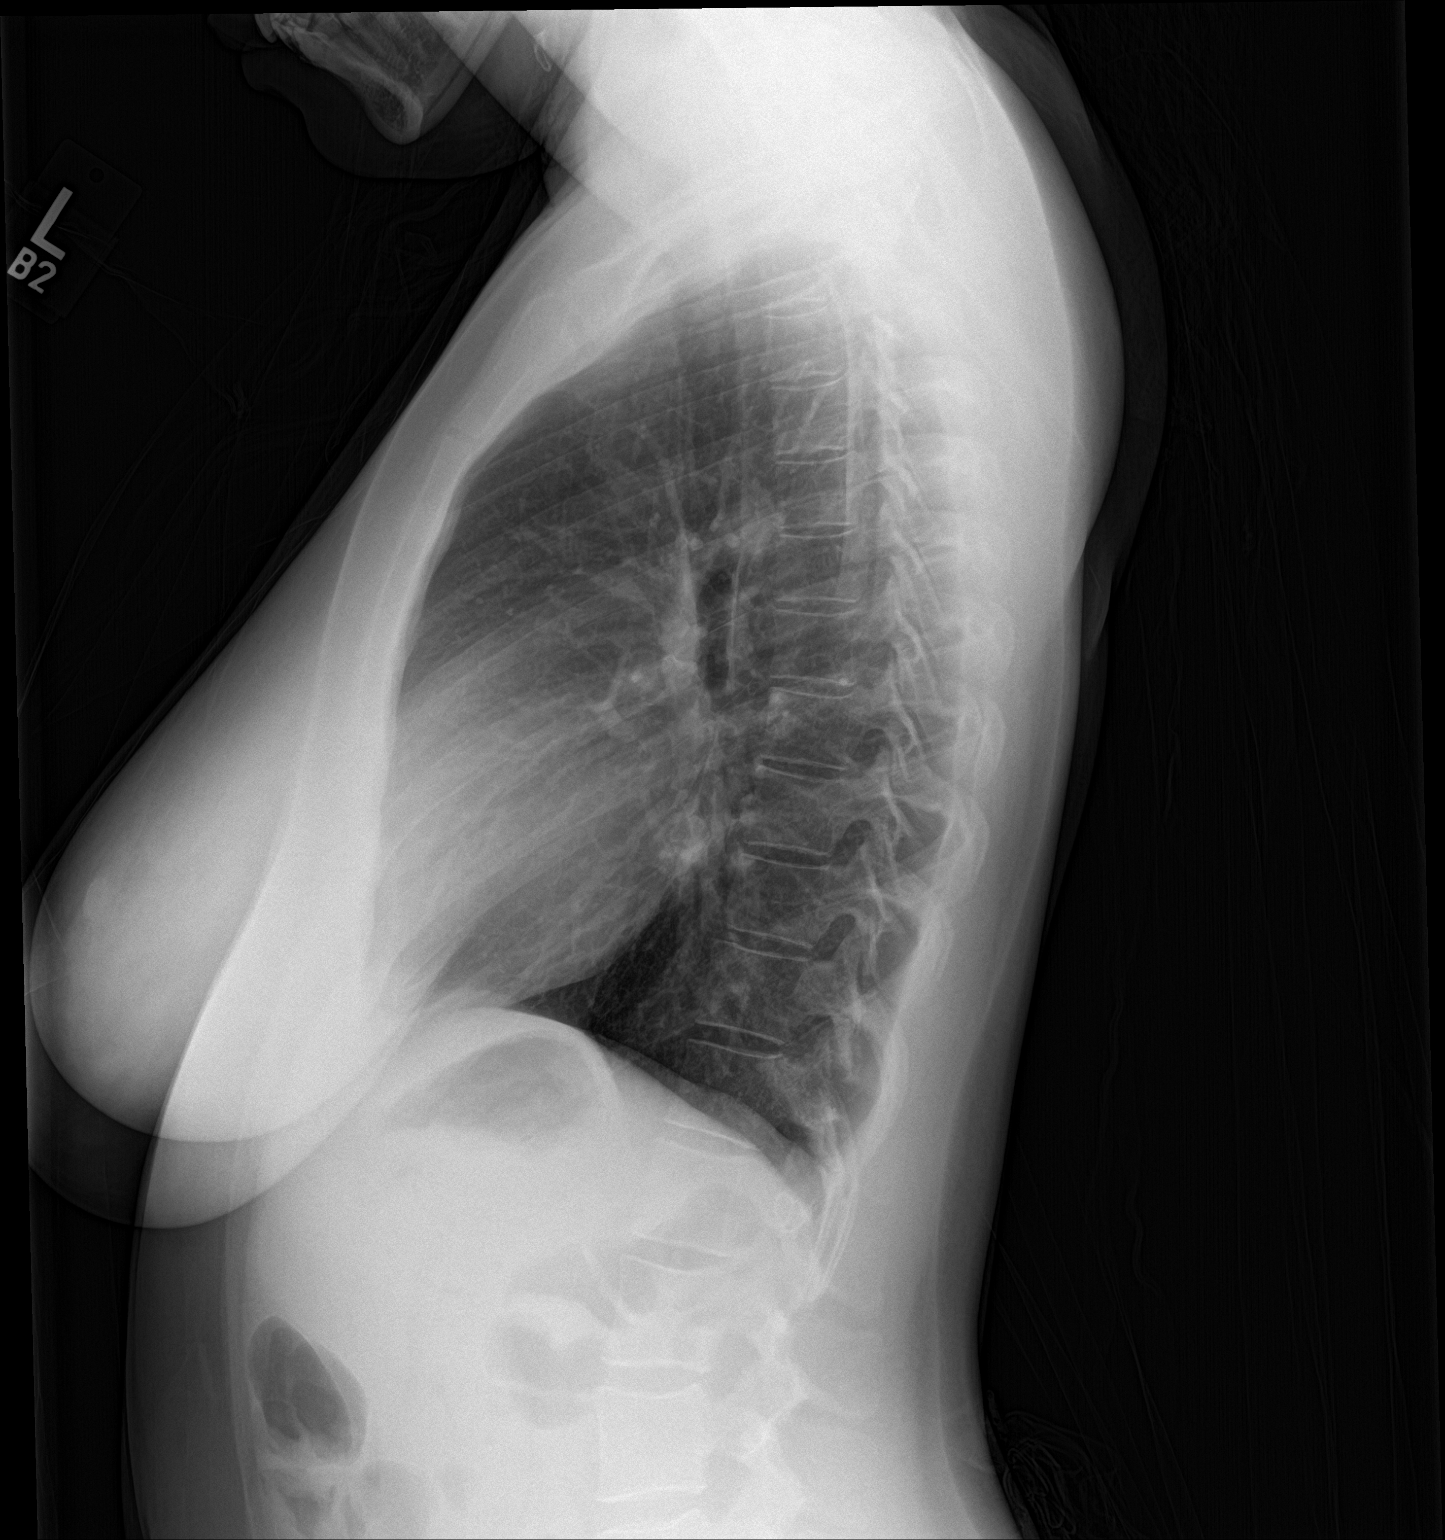

[2 of 2 positions shown; findings below may reference images not displayed]

FINDINGS: The heart size and mediastinal contours are within normal limits.
Both lungs are clear. No pneumothorax or pleural effusion is noted.
The visualized skeletal structures are unremarkable.
IMPRESSION: No active cardiopulmonary disease.

## 2019-06-27 ENCOUNTER — Other Ambulatory Visit: Payer: Self-pay | Admitting: Plastic Surgery

## 2022-01-02 ENCOUNTER — Ambulatory Visit: Payer: No Typology Code available for payment source | Admitting: Podiatry

## 2022-01-09 ENCOUNTER — Ambulatory Visit (INDEPENDENT_AMBULATORY_CARE_PROVIDER_SITE_OTHER): Payer: 59

## 2022-01-09 ENCOUNTER — Encounter: Payer: Self-pay | Admitting: Podiatry

## 2022-01-09 ENCOUNTER — Ambulatory Visit (INDEPENDENT_AMBULATORY_CARE_PROVIDER_SITE_OTHER): Payer: No Typology Code available for payment source | Admitting: Podiatry

## 2022-01-09 DIAGNOSIS — M21611 Bunion of right foot: Secondary | ICD-10-CM

## 2022-01-09 DIAGNOSIS — M21612 Bunion of left foot: Secondary | ICD-10-CM

## 2022-01-09 DIAGNOSIS — M21619 Bunion of unspecified foot: Secondary | ICD-10-CM

## 2022-01-09 NOTE — Progress Notes (Signed)
Subjective:   Patient ID: Ann Knapp, female   DOB: 36 y.o.   MRN: [id number]   HPI Patient states she has had chronic bunion deformity right over left its been very sore and making it hard to wear shoe gear comfortably.  And also irritated and injured her left big toe left concerned about that stating the bunions have gotten worse she has family history mother grandmother and aunts and she has tried wider shoes soaks and medicines without relief of symptoms.  Patient does not smoke likes to be active   Review of Systems  All other systems reviewed and are negative.       Objective:  Physical Exam Vitals and nursing note reviewed.  Constitutional:      Appearance: She is well-developed.  Pulmonary:     Effort: Pulmonary effort is normal.  Musculoskeletal:        General: Normal range of motion.  Skin:    General: Skin is warm.  Neurological:     Mental Status: She is alert.     Neurovascular status intact muscle strength adequate range of motion within normal limits with patient found to have large hyperostosis with redness and pain first metatarsal head right over left inflamed with fluid buildup around the bone structure itself.  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  I do think given her young age and family history and structural deformity with pain surgical intervention is indicated I do think distal osteotomy can be done.  Patient was given all information I discussed with her at great length she wants to do this at the end of October and will return in mid October to go over the consent form.  Did not note pathology left big toe from injury  X-rays indicate that there is elevation of the intermetatarsal angle of approximately 16 degrees right 50 degrees left with large protuberance of

## 2022-02-25 ENCOUNTER — Telehealth: Payer: Self-pay | Admitting: Urology

## 2022-02-25 NOTE — Telephone Encounter (Signed)
Pt called stating that she wanted to cxl her appt on 02/27/22 for her sx consult. Pt stated that she would like to wait an have sx next summer 2024 when her kids are out of school. I informed pt that would be fine and asked if she wanted to go ahead and reschedule that appt now or call back. She stated she would call back to reschedule her sx consult next year and then schedule sx at that appt with Dr. Paulla Dolly.

## 2022-02-27 ENCOUNTER — Ambulatory Visit: Payer: 59 | Admitting: Podiatry
